# Patient Record
Sex: Female | Born: 1960 | Race: Black or African American | Hispanic: No | Marital: Married | Smoking: Former smoker
Health system: Southern US, Community
[De-identification: ages and names within clinical notes are randomized; demographics above are authoritative.]

## PROBLEM LIST (undated history)

## (undated) DIAGNOSIS — F410 Panic disorder [episodic paroxysmal anxiety] without agoraphobia: Secondary | ICD-10-CM

## (undated) DIAGNOSIS — I10 Essential (primary) hypertension: Secondary | ICD-10-CM

## (undated) DIAGNOSIS — G459 Transient cerebral ischemic attack, unspecified: Secondary | ICD-10-CM

## (undated) DIAGNOSIS — E559 Vitamin D deficiency, unspecified: Secondary | ICD-10-CM

## (undated) DIAGNOSIS — G43909 Migraine, unspecified, not intractable, without status migrainosus: Secondary | ICD-10-CM

## (undated) HISTORY — PX: CERVIX LESION DESTRUCTION: SHX591

## (undated) HISTORY — DX: Transient cerebral ischemic attack, unspecified: G45.9

## (undated) HISTORY — PX: CERVICAL BIOPSY  W/ LOOP ELECTRODE EXCISION: SUR135

## (undated) HISTORY — DX: Vitamin D deficiency, unspecified: E55.9

## (undated) HISTORY — DX: Panic disorder (episodic paroxysmal anxiety): F41.0

## (undated) HISTORY — DX: Essential (primary) hypertension: I10

---

## 1988-10-06 HISTORY — PX: CHOLECYSTECTOMY: SHX55

## 1998-10-06 DIAGNOSIS — G459 Transient cerebral ischemic attack, unspecified: Secondary | ICD-10-CM

## 1998-10-06 HISTORY — DX: Transient cerebral ischemic attack, unspecified: G45.9

## 2002-06-24 ENCOUNTER — Other Ambulatory Visit: Admission: RE | Admit: 2002-06-24 | Discharge: 2002-06-24 | Payer: Self-pay | Admitting: *Deleted

## 2004-04-21 ENCOUNTER — Emergency Department (HOSPITAL_COMMUNITY): Admission: EM | Admit: 2004-04-21 | Discharge: 2004-04-21 | Payer: Self-pay | Admitting: Family Medicine

## 2004-04-28 ENCOUNTER — Emergency Department (HOSPITAL_COMMUNITY): Admission: EM | Admit: 2004-04-28 | Discharge: 2004-04-28 | Payer: Self-pay | Admitting: *Deleted

## 2004-05-01 ENCOUNTER — Ambulatory Visit (HOSPITAL_COMMUNITY): Admission: RE | Admit: 2004-05-01 | Discharge: 2004-05-01 | Payer: Self-pay | Admitting: Obstetrics & Gynecology

## 2004-05-07 ENCOUNTER — Encounter: Admission: RE | Admit: 2004-05-07 | Discharge: 2004-05-07 | Payer: Self-pay | Admitting: Obstetrics and Gynecology

## 2005-10-06 HISTORY — PX: OVARIAN CYST REMOVAL: SHX89

## 2005-12-02 ENCOUNTER — Emergency Department (HOSPITAL_COMMUNITY): Admission: EM | Admit: 2005-12-02 | Discharge: 2005-12-02 | Payer: Self-pay | Admitting: Family Medicine

## 2005-12-24 ENCOUNTER — Encounter: Admission: RE | Admit: 2005-12-24 | Discharge: 2005-12-24 | Payer: Self-pay | Admitting: *Deleted

## 2006-02-15 ENCOUNTER — Emergency Department (HOSPITAL_COMMUNITY): Admission: EM | Admit: 2006-02-15 | Discharge: 2006-02-16 | Payer: Self-pay | Admitting: Emergency Medicine

## 2006-03-31 ENCOUNTER — Emergency Department (HOSPITAL_COMMUNITY): Admission: EM | Admit: 2006-03-31 | Discharge: 2006-03-31 | Payer: Self-pay | Admitting: Family Medicine

## 2006-06-15 ENCOUNTER — Other Ambulatory Visit: Admission: RE | Admit: 2006-06-15 | Discharge: 2006-06-15 | Payer: Self-pay | Admitting: *Deleted

## 2006-12-17 ENCOUNTER — Ambulatory Visit (HOSPITAL_COMMUNITY): Admission: AD | Admit: 2006-12-17 | Discharge: 2006-12-17 | Payer: Self-pay | Admitting: *Deleted

## 2006-12-17 HISTORY — PX: PELVIC LAPAROSCOPY: SHX162

## 2007-07-07 ENCOUNTER — Other Ambulatory Visit: Admission: RE | Admit: 2007-07-07 | Discharge: 2007-07-07 | Payer: Self-pay | Admitting: *Deleted

## 2007-07-07 HISTORY — PX: CARDIAC CATHETERIZATION: SHX172

## 2007-07-14 ENCOUNTER — Encounter: Admission: RE | Admit: 2007-07-14 | Discharge: 2007-07-14 | Payer: Self-pay | Admitting: *Deleted

## 2007-07-20 ENCOUNTER — Inpatient Hospital Stay (HOSPITAL_COMMUNITY): Admission: EM | Admit: 2007-07-20 | Discharge: 2007-07-21 | Payer: Self-pay | Admitting: Emergency Medicine

## 2008-01-17 ENCOUNTER — Observation Stay (HOSPITAL_COMMUNITY): Admission: EM | Admit: 2008-01-17 | Discharge: 2008-01-18 | Payer: Self-pay | Admitting: Emergency Medicine

## 2008-01-17 IMAGING — CR DG SHOULDER 2+V*L*
3 series · 3 of 3 positions shown · non-contrast
Comparison: None.

CLINICAL DATA: 47-year-old female with left shoulder pain and
numbness.  No known injury.

LEFT SHOULDER - 2+ VIEW

[w shoulder ap internal left]
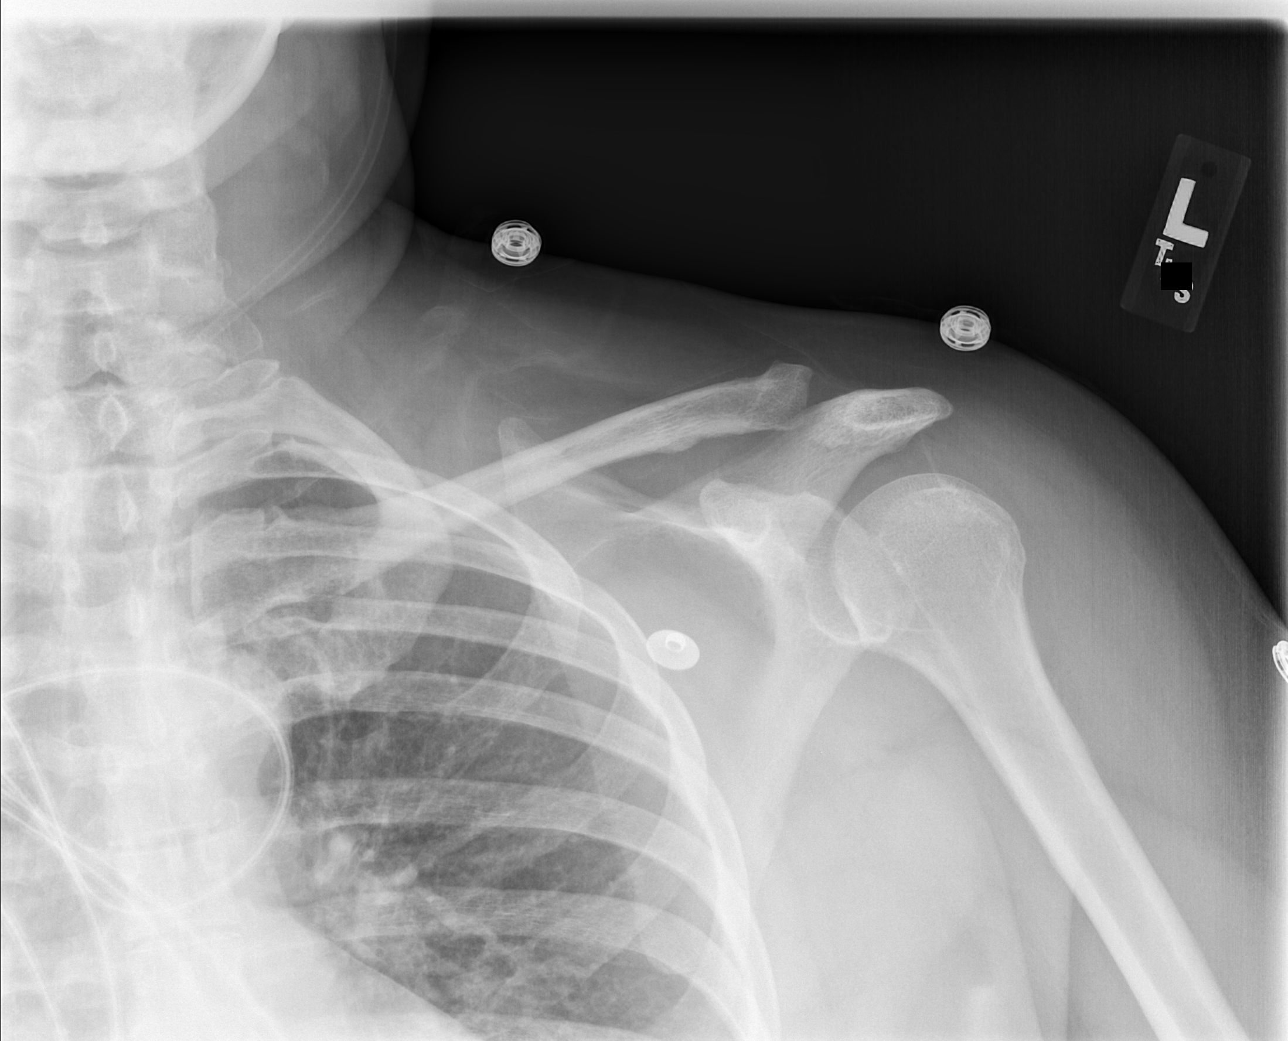

[w shoulder ap external left]
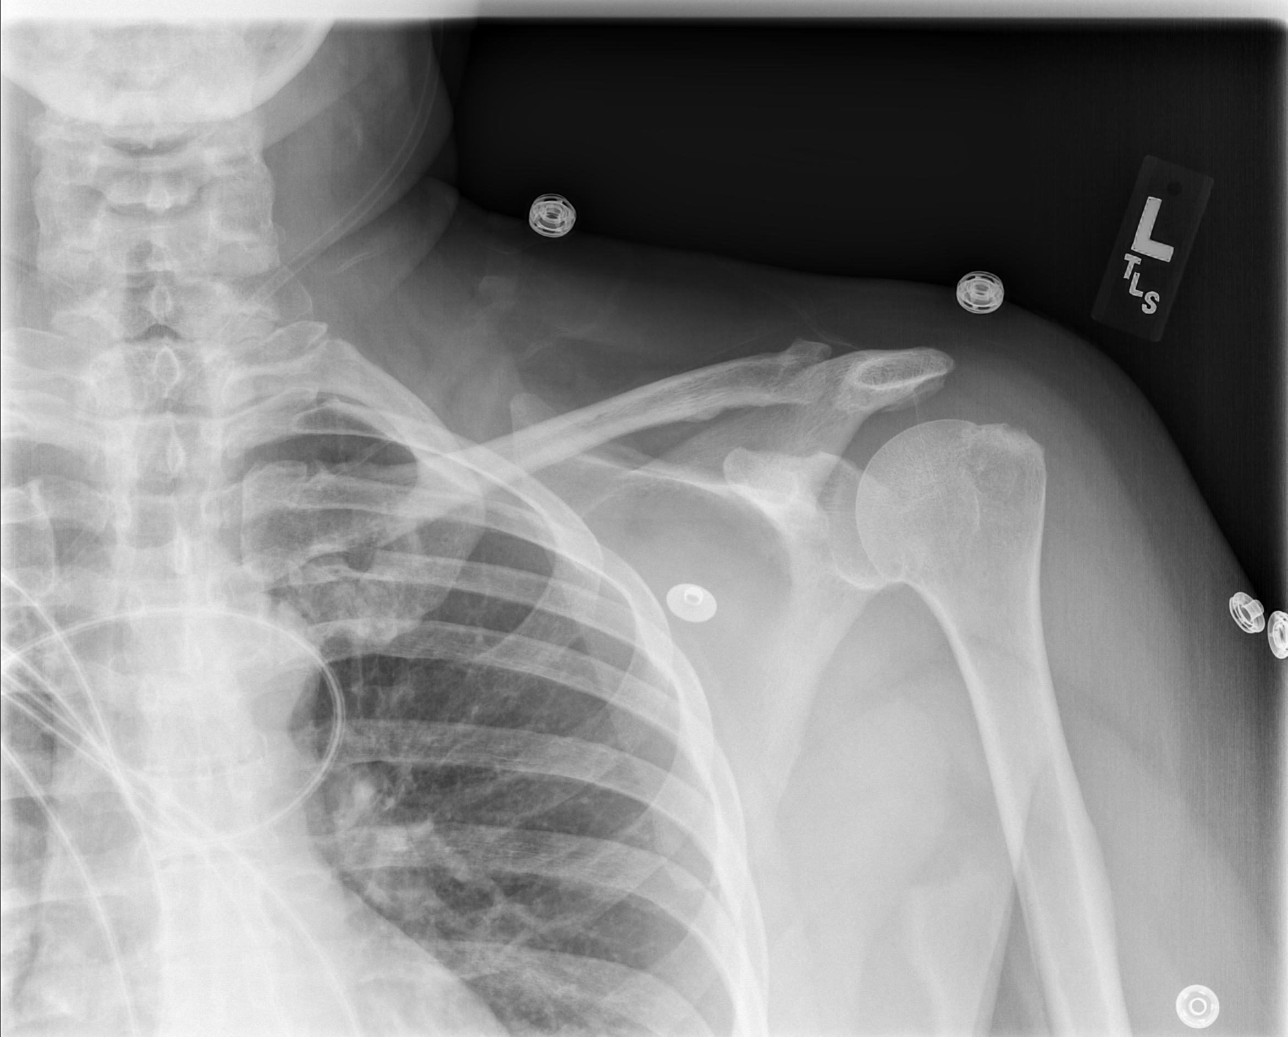

[w shoulder y view left]
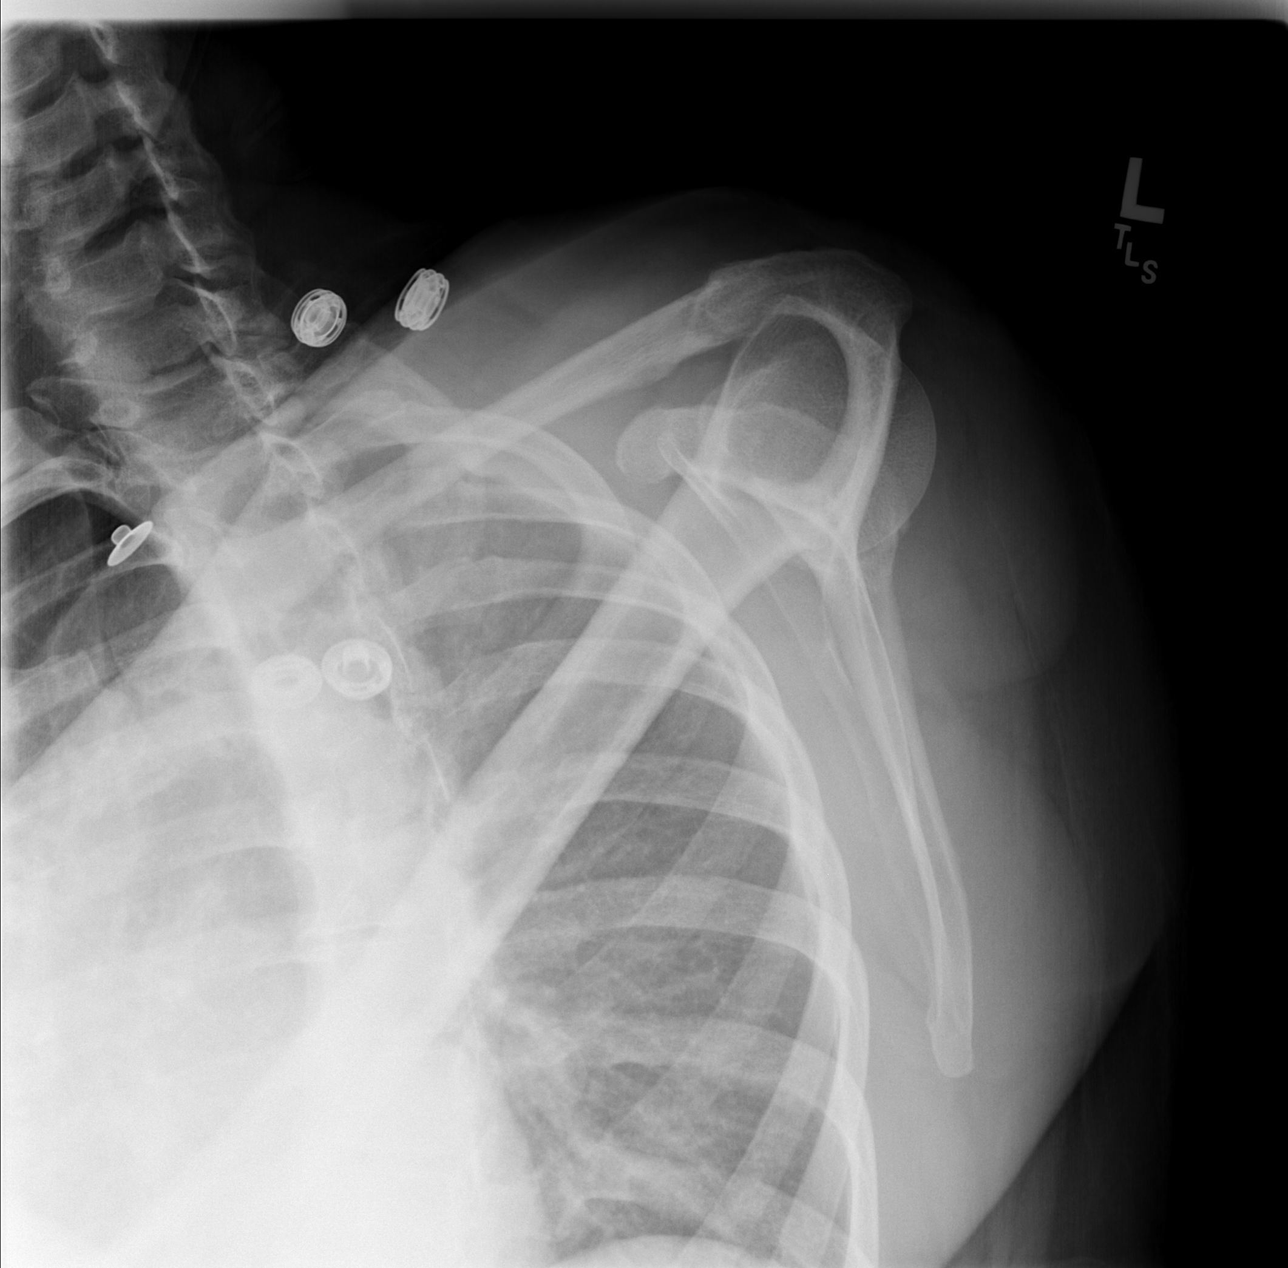

[3 of 3 positions shown; findings below may reference images not displayed]

FINDINGS: No glenohumeral joint dislocation.  Visualized proximal
left humerus is intact.  Left clavicle is intact.  Scapula appears
intact.  Visualized left ribs and left lung parenchyma are within
normal limits.
IMPRESSION: No acute fracture or dislocation identified about the left
shoulder.

## 2008-09-05 ENCOUNTER — Ambulatory Visit: Payer: Self-pay | Admitting: Gynecology

## 2008-09-12 ENCOUNTER — Encounter: Admission: RE | Admit: 2008-09-12 | Discharge: 2008-09-12 | Payer: Self-pay | Admitting: Internal Medicine

## 2008-09-13 ENCOUNTER — Ambulatory Visit: Payer: Self-pay | Admitting: Gynecology

## 2008-11-16 ENCOUNTER — Ambulatory Visit: Payer: Self-pay | Admitting: Gynecology

## 2008-11-20 ENCOUNTER — Inpatient Hospital Stay (HOSPITAL_COMMUNITY): Admission: RE | Admit: 2008-11-20 | Discharge: 2008-11-22 | Payer: Self-pay | Admitting: Gynecology

## 2008-11-20 ENCOUNTER — Ambulatory Visit: Payer: Self-pay | Admitting: Gynecology

## 2008-11-20 ENCOUNTER — Encounter: Payer: Self-pay | Admitting: Gynecology

## 2008-11-20 HISTORY — PX: ABDOMINAL HYSTERECTOMY: SHX81

## 2008-11-24 ENCOUNTER — Ambulatory Visit: Payer: Self-pay | Admitting: Women's Health

## 2008-11-30 ENCOUNTER — Ambulatory Visit: Payer: Self-pay | Admitting: Gynecology

## 2008-12-13 ENCOUNTER — Ambulatory Visit: Payer: Self-pay | Admitting: Gynecology

## 2009-01-10 ENCOUNTER — Ambulatory Visit: Payer: Self-pay | Admitting: Gynecology

## 2009-01-22 ENCOUNTER — Ambulatory Visit: Payer: Self-pay | Admitting: Gynecology

## 2009-09-10 ENCOUNTER — Other Ambulatory Visit: Admission: RE | Admit: 2009-09-10 | Discharge: 2009-09-10 | Payer: Self-pay | Admitting: Gynecology

## 2009-09-10 ENCOUNTER — Ambulatory Visit: Payer: Self-pay | Admitting: Gynecology

## 2009-09-27 ENCOUNTER — Encounter: Admission: RE | Admit: 2009-09-27 | Discharge: 2009-09-27 | Payer: Self-pay | Admitting: Gynecology

## 2010-01-23 ENCOUNTER — Emergency Department (HOSPITAL_COMMUNITY): Admission: EM | Admit: 2010-01-23 | Discharge: 2010-01-23 | Payer: Self-pay | Admitting: Family Medicine

## 2010-01-23 ENCOUNTER — Observation Stay (HOSPITAL_COMMUNITY): Admission: EM | Admit: 2010-01-23 | Discharge: 2010-01-23 | Payer: Self-pay | Admitting: Emergency Medicine

## 2010-06-15 ENCOUNTER — Emergency Department (HOSPITAL_COMMUNITY): Admission: EM | Admit: 2010-06-15 | Discharge: 2010-06-15 | Payer: Self-pay | Admitting: Family Medicine

## 2010-09-17 ENCOUNTER — Ambulatory Visit: Payer: Self-pay | Admitting: Gynecology

## 2010-09-17 ENCOUNTER — Other Ambulatory Visit
Admission: RE | Admit: 2010-09-17 | Discharge: 2010-09-17 | Payer: Self-pay | Source: Home / Self Care | Admitting: Gynecology

## 2010-10-14 ENCOUNTER — Encounter
Admission: RE | Admit: 2010-10-14 | Discharge: 2010-10-14 | Payer: Self-pay | Source: Home / Self Care | Attending: Gynecology | Admitting: Gynecology

## 2010-10-21 ENCOUNTER — Ambulatory Visit
Admission: RE | Admit: 2010-10-21 | Discharge: 2010-10-21 | Payer: Self-pay | Source: Home / Self Care | Attending: Gynecology | Admitting: Gynecology

## 2010-10-27 ENCOUNTER — Encounter: Payer: Self-pay | Admitting: Internal Medicine

## 2010-10-27 ENCOUNTER — Emergency Department (HOSPITAL_COMMUNITY)
Admission: EM | Admit: 2010-10-27 | Discharge: 2010-10-27 | Payer: Self-pay | Source: Home / Self Care | Admitting: Emergency Medicine

## 2010-10-27 ENCOUNTER — Encounter: Payer: Self-pay | Admitting: *Deleted

## 2010-10-27 ENCOUNTER — Encounter: Payer: Self-pay | Admitting: Gynecology

## 2010-10-27 ENCOUNTER — Encounter: Payer: Self-pay | Admitting: Family Medicine

## 2010-10-29 LAB — POCT RAPID STREP A (OFFICE): Streptococcus, Group A Screen (Direct): NEGATIVE

## 2010-12-24 LAB — POCT I-STAT, CHEM 8
Calcium, Ion: 1.14 mmol/L (ref 1.12–1.32)
Potassium: 3.5 mEq/L (ref 3.5–5.1)
Sodium: 143 mEq/L (ref 135–145)
TCO2: 30 mmol/L (ref 0–100)

## 2010-12-24 LAB — BASIC METABOLIC PANEL
Chloride: 106 mEq/L (ref 96–112)
GFR calc Af Amer: 57 mL/min — ABNORMAL LOW (ref >60)
Sodium: 139 mEq/L (ref 135–145)

## 2010-12-24 LAB — DIFFERENTIAL
Basophils Absolute: 0 10*3/uL (ref 0.0–0.1)
Basophils Relative: 1 % (ref 0–1)
Eosinophils Absolute: 0.3 10*3/uL (ref 0.0–0.7)
Eosinophils Relative: 4 % (ref 0–5)
Lymphocytes Relative: 33 % (ref 12–46)
Monocytes Absolute: 0.5 10*3/uL (ref 0.1–1.0)
Neutro Abs: 5 10*3/uL (ref 1.7–7.7)

## 2010-12-24 LAB — POCT CARDIAC MARKERS
CKMB, poc: <1 ng/mL — ABNORMAL LOW (ref 1.0–8.0)
Myoglobin, poc: 92.1 ng/mL (ref 12–200)
Troponin i, poc: <0.05 ng/mL (ref 0.00–0.09)

## 2010-12-24 LAB — CBC
HCT: 37.5 % (ref 36.0–46.0)
MCHC: 33.5 g/dL (ref 30.0–36.0)
Platelets: 195 10*3/uL (ref 150–400)
RDW: 14.2 % (ref 11.5–15.5)

## 2011-01-21 LAB — URINALYSIS, ROUTINE W REFLEX MICROSCOPIC
Ketones, ur: NEGATIVE mg/dL
Leukocytes, UA: NEGATIVE
Nitrite: NEGATIVE
Specific Gravity, Urine: <1.005 — ABNORMAL LOW (ref 1.005–1.030)
Urobilinogen, UA: 0.2 mg/dL (ref 0.0–1.0)
pH: 6.5 (ref 5.0–8.0)

## 2011-01-21 LAB — CBC
HCT: 38.6 % (ref 36.0–46.0)
Hemoglobin: 12.6 g/dL (ref 12.0–15.0)
MCHC: 33 g/dL (ref 30.0–36.0)
Platelets: 253 10*3/uL (ref 150–400)
Platelets: 190 10*3/uL (ref 150–400)
RBC: 3.55 MIL/uL — ABNORMAL LOW (ref 3.87–5.11)

## 2011-01-21 LAB — COMPREHENSIVE METABOLIC PANEL
BUN: 7 mg/dL (ref 6–23)
Calcium: 9 mg/dL (ref 8.4–10.5)
Glucose, Bld: 86 mg/dL (ref 70–99)
Total Protein: 7.5 g/dL (ref 6.0–8.3)

## 2011-01-21 LAB — BASIC METABOLIC PANEL
BUN: 5 mg/dL — ABNORMAL LOW (ref 6–23)
CO2: 27 mEq/L (ref 19–32)
Calcium: 8.3 mg/dL — ABNORMAL LOW (ref 8.4–10.5)
Creatinine, Ser: 1.28 mg/dL — ABNORMAL HIGH (ref 0.4–1.2)
GFR calc Af Amer: 54 mL/min — ABNORMAL LOW (ref >60)

## 2011-01-21 LAB — URINE MICROSCOPIC-ADD ON

## 2011-01-21 LAB — HCG, SERUM, QUALITATIVE: Preg, Serum: NEGATIVE

## 2011-02-18 NOTE — H&P (Signed)
[name], [name]      ACCOUNT NO.:  [id number]   MEDICAL RECORD NO.:  [id number]          PATIENT TYPE:  INP   LOCATION:  3029                         FACILITY:  [facility]   PHYSICIAN:  Deanna Artis. Hickling, M.D.DATE OF BIRTH:  Feb 19, 1961   DATE OF ADMISSION:  01/17/2008  DATE OF DISCHARGE:                              HISTORY & PHYSICAL   CHIEF COMPLAINT:  I couldn't lift my left side, my left shoulder  started to hurt.  This occurred around 4:10.  She called her husband  around 4:15.  She arrived at 24.  I was contacted at 1708 for a code  stroke.  I reviewed the CT scan at 1725 and found it to be normal.  The  patient states that the left arm and leg are weak and numb, although the  arm is more affected.  She complains of pain in her left shoulder, which  appears to be localized to the glenohumeral joint.  She was resistant to  her husband bringing her to the hospital and during the examination  keeps asking why she was numb, worrying about the 12,000 dollars of her  last hospitalization, which was negative cardiac cath, and saying that  she should not be in the hospital on spring break.   The patient has had number of stressors, she is worried about losing her  job.  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  There are problems at home.  She has had a longstanding  history of depression.  She said that she had many strokes number of  years ago, but she has not had any in quite some time.  She quit smoking  at that time.   Her risk factors for stroke include hypertension.  She has had some  problems with uterine fibroids and was scheduled to have hysterectomy  when she developed chest pain.  She was admitted to the hospital in  October and that workup was negative.   CURRENT MEDICATIONS:  1. Benicar 20/12.5, 1 daily.  2. Naproxen 550 mg every 8-12 hours.  3.  Metronidazole 500 mg 4 tablets per day, which she takes on a      monthly basis and not certain why.   DRUG ALLERGIES:  1. CODEINE.  2. DILAUDID.  3. ZOFRAN.   FAMILY HISTORY:  Maternal grandmother had stroke and died of lung  cancer.  Paternal grandmother died of cervical cancer.  Father has  hypertension.  Mother is alive and well, except that she had uterine  cancer and had a hysterectomy.  She is in remission.  The patient's  sister had cervical cancer.  Maternal aunt had a myocardial infarction.   SOCIAL HISTORY:  It has been described in part above.  The patient has 2  children and is married.  She has a number of concerns that are  preoccupying her now.  She quit smoking about 10 years ago.  She drinks  occasionally.  She does not use drugs.   REVIEW OF SYSTEMS:  Twelve-system review is remarkable for  weakness in  her left arm and pain in her left shoulder.  She is a gravida 2, para 2  and has depression.  Twelve-system review otherwise negative.   PHYSICAL EXAM:  VITAL SIGNS:  Temperature 98, blood pressure 144/78,  resting pulse 91, respirations 16, and oxygen saturation 100%.  GENERAL:  She is obese.  HEAD, EYES, EARS, NOSE, AND THROAT:  No infections.  No bruits.  NECK:  Supple.  She has pain in her left shoulder as noted above.  LUNGS:  Clear.  HEART:  No murmurs.  Pulses normal.  ABDOMEN:  Protuberant.  Bowel sounds normal.  No hepatosplenomegaly.  EXTREMITIES:  Well formed.  SKIN:  No lesions.  NEUROLOGIC:  Awake, alert, cheerful, speaking in a soft voice pursing  her lips, so that it sounds as though she is dysarthric.  Cranial  Nerves:  Round and reactive pupils.  Visual fields full.  Extraocular  movements full and conjugate.  She really has symmetric facial strength  when I asked her to smile, midline tongue and uvula.  Air conduction  greater than bone conduction bilaterally.  Motor examination:  The  patient drifts down with her left arm and leg, but when I  asked her to  give maximal effort for 2 seconds, she shows normal strength on the left  side.  She can wiggle her fingers and her toes, but in trying to oppose  her thumb and her fingers, she does well easily in the right hand.  With  the left hand, she very slowly brings her fingers together, as if she is  not able to do so.  The drift that she has is straight down.  There is  no pronator drift.  Sensation:  She has a left hemihypesthesia that  respects the midline.  She has good sensation to double simultaneous  stimuli.  It does not extinguish, and she had good stereognosis.  Gait  was not tested.  Reflexes were symmetric and diminished.  The patient  had bilateral flexor plantar responses.   IMPRESSION:  1. Left-sided weakness and numbness; I believe, this is the functional      exam.  2. Left shoulder pain.  3. Depression.   PLAN:  We will perform a limited MRI scan tonight and MRA; if negative,  we may discharge the patient in the morning.  I will discuss with my  partner, Dr. Pearlean Brownie, the utility of further workup at this time.  I  appreciate the opportunity to participate in her care.  I have shared my  concerns with her husband and asked him not to speak with the patient  about this.      Deanna Artis. Sharene Skeans, M.D.  Electronically Signed     [facility]/MEDQ  D:  01/17/2008  T:  01/18/2008  Job:  [id number]   cc:   Fleet Contras, M.D.

## 2011-02-18 NOTE — Cardiovascular Report (Signed)
NAMELAKEIDRA, RELIFORD      ACCOUNT NO.:  [id number]   MEDICAL RECORD NO.:  [id number]          [name] TYPE:  INP   LOCATION:  2915                         FACILITY:  [facility]   PHYSICIAN:  Nanetta Batty, M.D.   DATE OF BIRTH:  19-Mar-1961   DATE OF PROCEDURE:  DATE OF DISCHARGE:                            CARDIAC CATHETERIZATION   [name] is a 46-year mildly overweight African-American female  with history of hypertension, admitted by Dr. Gaspar Garbe B. Little on  July 20, 2007 with substernal chest pain.  She had a negative CT for  PE.  She ruled out for myocardial infarction.  She had no EKG changes.  She was pain free on heparin and nitro and presents now for diagnostic  coronary angiography to define anatomy and rule out ischemic etiology.   DESCRIPTION OF PROCEDURE:  [name] brought to the second floor [facility] in the postabsorptive state.  She was premedicated  with p.o. Valium, IV Versed and fentanyl.  Right groin was prepped and  shaved in usual sterile fashion.  One percent Xylocaine was used for  local anesthesia.  A 6-French sheath was inserted into the right femoral  artery using standard Seldinger technique.  A 6-French, Judkins  diagnostic catheter as well as [place] pigtail catheter were used for  selective cholangiography and left ventriculography, respectively.  Visipaque dye was used for the entirety of the case.  Aortic,  ventricular blood pressures were recorded.   HEMODYNAMIC RESULTS:  Aortic systolic pressure 105, diastolic pressure  93.  Left ventricular systolic pressure 117 and end-diastolic pressure  18.  There is no pullback gradient noted.   SELECTIVE CORONARY ANGIOGRAPHY:  1. Left main normal.  2. LAD.  The LAD had 30 to 40% hypodense lesion proximally after the      first diagonal branch and septal perforator.  3. Distal circumflex.  Nondominant and free of systemic disease.  4. Ramus branch.  Normal.  5. Right  coronary artery.  Dominant vessel and free of systemic      disease.   VENTRICULOGRAPHY:  RAO, left ventriculogram performed using 25 mL of  Visipaque dye at 12 mL per second.  The overall LVEF estimated greater  than 60% without focal wall motion abnormalities.   IMPRESSION:  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  I do not think her chest pain is ischemic.  Continued medical therapy will be recommended.   The ACT was measured.  A femoral shot was obtained to determine  appropriateness StarClose hemostasis which was done and successfully  deployed.  [name] left the lab in stable condition.   She will remain recumbent for 2 hours, will ambulate for 1 hour and be  discharged home on proton-pump inhibition.  She will see Gaspar Garbe B.  Little, M.D. back in follow-up.  She should have an outpatient Myoview  in light of her proximal LAD disease.      Nanetta Batty, M.D.  Electronically Signed     JB/MEDQ  D:  07/21/2007  T:  07/22/2007  Job:  [id number]  cc:   [name], M.D.  Fleet Contras, M.D.  [facility] 2nd floor [facility]

## 2011-02-18 NOTE — H&P (Signed)
NAME:  [name], [name]      ACCOUNT NO.:  [id number]   MEDICAL RECORD NO.:  [id number]          PATIENT TYPE:  AMB   LOCATION:  SDC                           [facility]   PHYSICIAN:  Juan H. Lily Peer, M.D.DATE OF BIRTH:  October 14, 1960   DATE OF ADMISSION:  DATE OF DISCHARGE:                              HISTORY & PHYSICAL   The patient is scheduled for surgery on Monday, November 20, 2008, 7:30  a.m. at [facility].  Please have history and physical available.   CHIEF COMPLAINT:  Symptomatic leiomyomatous uteri.   HISTORY:  The patient is a 50 year old gravida 2, para 2 who was first  seen in the office on September 05, 2008, and September 13, 2009,  respectively.  She had been referred to one of the primary care  physician in the community as a result of the patient's worsening  dysmenorrhea and menorrhagia, anemia, and leiomyomatous uteri.  She had  a normal Pap smear November 2009, which was reportedly normal.  The  patient was being followed by another gynecologist in the community, but  he passed away and she was followed up now in our office, but the  definitive treatment for her worsening dysmenorrhea, menorrhagia, and  anemia was total abdominal hysterectomy.  Despite the patient being 50  years of age, she requested to have her ovaries left in place.  She had  an ultrasound done in our office, which demonstrated she had a 14 x 7 x  6 cm uterus with multiple fibroids totally 20 of various sizes, the  largest one measuring 33 x 25 mm.  Both ovaries appeared to be normal.  Sonohysterogram demonstrated no intracavitary defect.  An endometrial  stripe was 1.8 mm.  An endometrial biopsy that was performed on the same  setting demonstrated benign weekly secretory endometrium with no  evidence of hyperplasia.  Her CBC had demonstrated white blood count of  12.9, hemoglobin/hematocrit with 12 and 36.9 respectively with a  platelet count of 294,000.  There was a question in  the past whether the  patient had TIAs or may have had some form of aneurysm.  She had been  followed by Dr. Pearlean Brownie, the neurologist who recently cleared her for  surgery.  He had seen her as part of the evaluation for mixed migraine  and tension headaches which appeared to have been stable during an  episode of a transient slurred speech and left-sided weakness in April  2009 with unclear etiology.  He thought possibly this was related to  stress and anxiety.  In April 2009, she had an MRI with and without  contrast and MRA.  The MRI was reported to be normal.  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 Pearlean Brownie.   PAST MEDICAL HISTORY:  She is allergic to CODEINE, LEIDEN, ZOFRAN.   Medications have consisted of Ambien p.r.n., multivitamin daily, calcium  and vitamin D twice  a day, Singulair p.r.n., Xanax 10 mg daily, Benicar  HCT q. daily, ibuprofen, and Naprosyn p.r.n., and recently she had been  placed on Megace 40 mg b.i.d. to have controlled her bleeding.   PAST MEDICAL HISTORY:  As mentioned above, questionable TIA in year  2000, history of panic attacks, history of hypertension.   SURGICAL HISTORY:  She had a negative cardiac catheterization in October  2008 done by Dr. Nanetta Batty in 2008, and his impression was that she  had a noncritical coronary artery disease with mild proximal left  anterior descending artery disease, normal left ventricular function,  and he did not feel at that time that her chest pain had been attributed  to any ischemia.  Other surgeries consisted of C-section 1982, cervical  cone biopsy, cholecystectomy in 1990, and also laparoscopy for torsed  right adnexa in 2007 resulting in laparotomy.   FAMILY HISTORY:  Parents  with hypertension.  Mother and paternal  grandmother with uterine cancer and maternal grandmother with lung  cancer.   PHYSICAL EXAMINATION:  VITAL SIGNS:  The patient weighs 124 pounds.  She  is 5 feet 3-1/4 inches tall.  Blood pressure 120/80.  HEENT:  Unremarkable.  NECK:  Supple.  Trachea midline.  No carotid bruits or thyromegaly.  LUNGS:  Clear to auscultation without rhonchi or wheezing.  HEART:  Regular rate and rhythm.  No murmurs or gallop.  BREAST:  Exam not done.  ABDOMEN:  Soft and slightly pendulous.  Subumbilical scar as well as  Pfannenstiel scar.  PELVIC:  Bartholin, urethra, Skene's within normal limits.  Vagina and  cervix; no gross lesions on inspection.  Uterus; irregular-sized uterus  approximately 12-14 weeks' size.  Adnexa difficult to evaluate as a  result of the large uterus.   ASSESSMENT:  A 50 year old gravida 2, para 2 with symptomatic  leiomyomatous uteri scheduled to undergo total abdominal hysterectomy  with ovarian conservation.  Risks, benefits, and pros and cons of the  operation were discussed include the following risks for deep venous  thrombosis and was resulting pulmonary embolism.  Prophylactically, we  will place PSA stockings.  Also risk for infection, she will be given  intravenous antibiotic.  Also in the event the patient to need any blood  or blood products, she is fully aware of the potential risks of 1 in  100,000 anaphylactic reaction from donor to recipient as well as  hepatitis and AIDS.  Also in the event of any trauma or injury to  bladder, intestines, or other internal organs, a reparative surgery may  needed to be done at that time, and also the remote possibility in the  event of a life saving measure or any abnormality noted that one or both  ovaries may need to be removed, thus she would need to be placed on  hormone replacement postoperatively, but all efforts will be undertaken  to preserve both ovaries per the patient's  wishes.  All these issues  were discussed with the patient.  All questions were answered.  We will  follow accordingly.   PLAN:  The patient scheduled for total abdominal hysterectomy with  ovarian conservation on Monday, November 20, 2008, at 7:30 a.m. at  [facility]  Please have history and physical available.      Juan H. Lily Peer, M.D.  Electronically Signed     JHF/MEDQ  D:  11/19/2008  T:  11/20/2008  Job:  [id number]

## 2011-02-18 NOTE — Discharge Summary (Signed)
NAME:  [name], [name]      ACCOUNT NO.:  [id number]   MEDICAL RECORD NO.:  [id number]          PATIENT TYPE:  INP   LOCATION:  9303                          [facility]ACILITY[facility]  [facility]   PHYSICIAN:  Juan H. Lily Peer, M.D.DATE OF BIRTH:  1961/04/01   DATE OF ADMISSION:  11/20/2008  DATE OF DISCHARGE:                               DISCHARGE SUMMARY   HISTORY:  The patient is a 50 year old gravida 2, para 2, who was taken  to the operating room in the morning of November 20, 2008, where she  underwent a total abdominal hysterectomy secondary to symptomatic  leiomyomas uteri that was contributing to dysmenorrhea, menorrhagia, and  anemia.  The patient with known history of hypertension.  She had been  followed by her primary physician, Dr. Robyne Askew, and the patient had been  on Benicar HCT for hypertension, and preoperatively, it had been noted  that the patient had a creatinine that was 1.24 and her potassium was  3.3 preoperatively.  The patient did well intraoperatively.  She had  lost approximately 150 mL of blood during her surgery.  On her first  postoperative day, hemoglobin and hematocrit were 9.8 and 29.6  respectively.  Platelet count 190,000.  Her serum electrolytes did  demonstrate potassium of 3.2.  Her creatinine was 1.28.  Glomerular  filtration rate for an African American was low at 54 postop, and  preoperatively it had been at 56.  The patient's Foley was removed after  24 hours.  She was placed on supplemental K-Dur orally, 20 mEq p.o.  daily.  She has been started on liquid diet after her Foley was removed,  and she began to ambulate and was voiding spontaneously.  She had a  great urinary output of greater than 400 mL per hour and clear.  By the  second day, she was up and ambulating.  She had taken a shower and  tolerated a regular diet.  She described at times having some spasms in  her bladder and urinary frequency, and she started a small spurt and  then regained her  urine, throughout the day.  Upon discharge, she will  be placed on Urispas, an antispasmodic agent to help with her bladder  spasms postoperatively.  She was ready to be discharged home on second  postoperative day.  She continued to remain normotensive.   FINAL DIAGNOSES:  1. Leiomyomas uteri.  2. Anemia.  3. Dysmenorrhea.  4. Hypokalemia.  5. Hypertension.  6. Anxiety.   PROCEDURE PERFORMED:  1. Total abdominal hysterectomy.  2. Supplementation with potassium.   FINAL DISPOSITION AND FOLLOWUP:  The patient was discharged home on her  second postoperative day.  She will make arrangements to follow up with  Dr. Robyne Askew, who is her internist to follow up due to her history of  hypertension and slightly elevated creatinine level.  She was given a  prescription of Darvocet to take one p.o. q.4-6 h. p.r.n. pain, and  Reglan 10 mg one p.o. q.4-6 h. p.r.n. nausea and vomiting, K-Dur 20 mEq  to take one p.o. daily  and iron supplementation one p.o. daily and Urispas 100 mg one p.o.  b.i.d. to  t.i.d. for the next 3-5 days for bladder spasms.  Discharge  instructions were provided.  She is to continue also her blood pressure  medication and her Xanax as previously prescribed.      Juan H. Lily Peer, M.D.  Electronically Signed     JHF/MEDQ  D:  11/22/2008  T:  11/22/2008  Job:  [id number]

## 2011-02-18 NOTE — Discharge Summary (Signed)
NAME:  [name], [name]      ACCOUNT NO.:  [id number]   MEDICAL RECORD NO.:  [id number]          PATIENT TYPE:  INP   LOCATION:  3029                         FACILITY:  [facility]   PHYSICIAN:  Pramod P. Pearlean Brownie, MD    DATE OF BIRTH:  12-24-1960   DATE OF ADMISSION:  01/17/2008  DATE OF DISCHARGE:  01/18/2008                               DISCHARGE SUMMARY   ADMISSION DIAGNOSIS:  Left-sided pain and weakness.   DISCHARGE DIAGNOSES:  1. Transient dysarthria and left-sided weakness and pain likely of      nonorganic etiology.  2. Depression, anxiety.   HOSPITAL COURSE:  Ms. Sappington is a 50 year old pleasant African  American lady, who was brought to [facility] emergency room for  evaluation for sudden onset of left shoulder pain and left upper  extremity numbness followed by some left lower extremity numbness and  weakness.  She also had some speech difficulties and seen in the  emergency room by Dr. Sharene Skeans.  She was found to have a neurological  exam, which was thought to be nonorganic.  She had some slurred speech  with very variable effort with some numbness on the left side with  objective weakness, which was giveaway.  She was not felt to be  candidate for thrombolysis due to her exam, which was thought to be  nonorganic and deficits were mild.  She was admitted for stroke workup.  The CT scan of the head was unremarkable.  She was kept on telemetry  monitoring, which did not seek cardiac arrhythmia.  Subsequently, an MRI  scan was obtained the next day, which was completely normal without  evidence of acute or old stroke.  MRA of the brain showed no significant  vascular stenosis.  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  The patient's rest of the workup included homocystine, which  was normal at 7.6, hemoglobin  A1c was normal at 5.4.  Lipid profile  showed total cholesterol 177, triglycerides were elevated minimally at  159, HDL was 38, LDL was borderline at 107.  The patient's white count  was slightly elevated at 13.5 with normal hemoglobin, hematocrit and  basic metabolic panel, and cardiac enzymes were also negative.  The  patient's symptoms resolved overnight, and she was given some Naprosyn,  ibuprofen for her shoulder pain, which also resolved.  On the day of  discharge, she had a nonfocal neurological exam with normal speech and  no left-sided weakness and numbness.  It was unclear as to the etiology  of her symptoms, but due to combination of symptoms starting with  shoulder pain, this was likely to be nonorganic and perhaps related to  underlying stress and anxiety.  The patient was started on aspirin 325  mg a day, advised to take it every day.  She was also advised to resume  her home medications; hydrochlorothiazide 12.5 mg a day, Avapro 150 mg  daily, and Naprosyn as needed for pain.  She was advised to follow up  with her  primary physician Dr. Alois Cliche in the next couple of weeks and  with Dr. Pearlean Brownie in his office in 2 months.  She was also informed that  the questionable 2 mm infundibulum or aneurysm in the posterior cerebral  artery may need a diagnostic cerebral angiogram, but this was not an  emergency and could be electively done as an outpatient.           ______________________________  Sunny Schlein. Pearlean Brownie, MD     PPS/MEDQ  D:  01/18/2008  T:  01/19/2008  Job:  [id number]

## 2011-02-18 NOTE — Op Note (Signed)
NAME:  [name], [name]      ACCOUNT NO.:  [id number]   MEDICAL RECORD NO.:  [id number]          PATIENT TYPE:  INP   LOCATION:  9303                          FACILITY:  [facility]   PHYSICIAN:  Juan H. Lily Peer, M.D.DATE OF BIRTH:  [birth year]   DATE OF PROCEDURE:  11/20/2008  DATE OF DISCHARGE:                               OPERATIVE REPORT   SURGEON:  Juan H. Lily Peer, MD   FIRST ASSISTANT:  Timothy P. Fontaine, MD   INDICATION FOR OPERATION:  A 50 year old gravida 2, para 2 with  dysmenorrhea, menorrhagia, anemia, and leiomyomatous uteri.   PREOPERATIVE DIAGNOSES:  1. Leiomyomatous uteri.  2. Dysmenorrhea.  3. Menorrhagia.  4. Anemia.   POSTOPERATIVE DIAGNOSES:  1. Leiomyomatous uteri.  2. Dysmenorrhea.  3. Menorrhagia.  4. Anemia.   ANESTHESIA:  General endotracheal anesthesia.   PROCEDURE PERFORMED:  Total abdominal hysterectomy.   FINDINGS:  A 12 to 14-week size leiomyomatous uteri and normal-appearing  ovaries.   DESCRIPTION OF OPERATION:  After the patient was adequately counseled,  she was taken to the operating room where she underwent a successful  general  endotracheal anesthesia.  A PSA stockings had been placed for  DVT prophylaxis and she had received a gram of cefotetan for prophylaxis  as well.  After the abdomen was prepped and draped in the usual sterile  fashion.  A Foley catheter had previously been placed for monitorization  of urinary output.  A Pfannenstiel skin incision was made 2 cm above the  symphysis pubis adjacent to the previous Pfannenstiel scar.  The  incision was carried out through skin and subcutaneous tissue down to  the rectus fascia where midline nick was made.  The fascia was incised  in the transverse fashion.  The peritoneal cavity was entered  cautiously.  O'Connor-O'Sullivan retractors were in place after the  patient had been placed in Trendelenburg position.  Assessment of the  uterus demonstrated multilobulated 12-14  weeks' size uterus with normal-  appearing ovaries.  Both proximal utero-ovarian ligament and fallopian  tube were grasped with Heaney clamps and placed under tension.  The  right round ligament was identified and transected with the Bovie.  The  surgeon's finger was placed in the posterior broad ligament and  penetrated through anteriorly in an effort to put a Heaney clamp,  hugging the uterus at the junction of the utero-ovarian ligament and  proximal tube.  Second Heaney clamp was placed adjacent to that and  transected between the remaining tube and ovary.  It was free tie of 0-  Vicryl suture followed by a transfixion stitch.  Skeletonization was  accomplished by incising the remainder of the broad and cardinal  ligaments to develop the bladder flap.  The broad and cardinal ligaments  serially clamped, cut, and suture ligated with 0-Vicryl suture to the  level of the right vaginal fornix.  Similar procedure was carried out on  the contralateral side and with the use of Jorgenson scissors, the  cervix was excised from the vagina and cervix and uterus was passed off  the operative field.  Both angles were secured with a transfixion stitch  of 0-Vicryl suture and  the remaining vaginal cuff was closed with  interrupted sutures of 0-Vicryl suture.  The pelvic cavity was copiously  irrigated with normal saline solution and for some of the raw surfaces  that were oozing after applying pressure for additional hemostasis.  Arista pattern was placed as a hemostatic agent.  Sponge and needle  count was correct.  The O'Connor-O'Sullivan retractor was removed.  The  visceral peritoneum was not reapproximated.  The rectus fascia was  closed with running stitch of 0-Vicryl suture.  The subcutaneous  bleeders were Bovie cauterized.  The skin was reapproximated with skin  clips followed by placed Xeroform gauze and 4 x4 dressings.  The patient  received 30 mg of Toradol en route to the recovery room.   IV fluids  consisted with 2 L of lactated Ringers.  Urine output was 400 mL.  An  EBL was 150 mL.      Juan H. Lily Peer, M.D.  Electronically Signed     JHF/MEDQ  D:  11/20/2008  T:  11/20/2008  Job:  [id number]

## 2011-02-21 NOTE — H&P (Signed)
NAMECIENNA, Cathy [name]      ACCOUNT NO.:  [id number]   MEDICAL RECORD NO.:  [id number]          [name] TYPE:  AMB   LOCATION:  DAY                          FACILITY:  [facility]   PHYSICIAN:  Cathy [name], M.D.   DATE OF BIRTH:  October 05, 1961   DATE OF ADMISSION:  12/17/2006  DATE OF DISCHARGE:                              HISTORY & PHYSICAL   CHIEF COMPLAINT:  Pain.   HISTORY:  The [name] is a 50 year old gravida 2, para 2, whose last  menstrual period was August 2007.  She has been placed on continuous  oral contraceptives since that time for suppression of menses, to  attempt to regulate menses.  This has been successful.  She is admitted  at this time because of severe right lower quadrant pain which began  approximately December 15, 2006 and has been intermittent but has now  become more continuous.  She experienced severe nausea and vomiting on  March 12, which has since relented.  She has had normal bowel activity  and normal appetite between bouts of pain.  Ultrasound performed in the  office on March 13 revealed enlarged, very tender right ovary.  This  suggested possible torsion of the ovary.  She is admitted at this time  for laparoscopy, possible right salpingo-oophorectomy.  She has been  fully counseled as to the nature of this procedure, to include the  procedure itself and risks involved including risks of anesthesia,  injury to uterus, tubes, ovaries, bowel, bladder, blood vessels,  ureters, postoperative hemorrhage, infection, recuperation.  She fully  understands all these considerations and wishes to proceed with surgery  on December 17, 2006.   PAST MEDICAL HISTORY:  Includes C-section in 1982 with tubal ligation at  that time, cholecystectomy approximately 1998, conization of the cervix  approximately that time as well.   ALLERGIES:  SHE IS ALLERGIC TO CODEINE, DILAUDID AND ZOFRAN.   MEDICATIONS:  Has taken the oral contraceptives which has been a  combination of estradiol and norethindrone, over the past 6 months.  Continuously, she has also had a prescription for Anaprox DS, which she  takes intermittently for some pain.   FAMILY HISTORY:  Includes maternal grandmother with carcinoma of the  lung and cervix and paternal grandmother with carcinoma of the ovary who  died at age 78.  Maternal grandmother also had diabetes.   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  She has changed her lifestyle habits and has had none in the  past 10 years approximately.  Recent CT scan of her brain has indicated  no problems.  She otherwise has had the gallbladder problem as noted for  an GI system.  CARDIORESPIRATORY:  Recent upper respiratory infection  which has resolved after being on Levaquin 750 mg daily.  GENITOURINARY:  As in present illness.  NEUROMUSCULAR:  Negative.   PHYSICAL EXAMINATION:  VITAL SIGNS:  Height 5 feet 3 inches, weight 218  pounds, blood pressure 122/72, pulse 104,  respirations 18, temperature  in our office of 98.4 Fahrenheit.  GENERAL:  Well-developed black female in moderate-to-severe distress, in  that she is posturing and writhing in pain somewhat.  HEENT:  Negative.  NECK:  Without masses, adenopathy or bruits.  LUNGS:  Clear to P&A.  HEART:  Regular rate and rhythm but elevated rate without murmurs.  BREASTS:  Examined sitting and lying, without mass.  Axilla negative.  ABDOMEN:  Soft with some tenderness in right lower quadrant and slight  rebound in this area.  Bowel sounds are somewhat depressed but are  active otherwise.  PELVIC:  External genitalia:  Bartholin's, urethra and Skene's glands  within normal limits.  Vagina is clean.  Cervix slightly inflamed.  Uterus mid-posterior, enlarged to approximately 8-[redacted] weeks gestational  size,  somewhat tender on motion.  ADNEXAL:  Reveals right to be full and very tender on palpation.  Left  is within normal limits.  Anterior posterior cul-de-sac exam is  confirmatory.  EXTREMITIES: Within normal limits.  CENTRAL NERVOUS SYSTEM:  Grossly intact.  SKIN:  Without suspicious lesions.   IMPRESSION:  Right adnexal pain, question ovarian torsion.   DISPOSITION:  As noted above.           ______________________________  Cathy [name], M.D.     SRF/MEDQ  D:  12/17/2006  T:  12/18/2006  Job:  [id number]

## 2011-02-21 NOTE — Discharge Summary (Signed)
NAMETAYNA, SMETHURST      ACCOUNT NO.:  [id number]   MEDICAL RECORD NO.:  [id number]          PATIENT TYPE:  INP   LOCATION:  2915                         FACILITY:  [facility]   PHYSICIAN:  Nanetta Batty, M.D.   DATE OF BIRTH:  1961-01-16   DATE OF ADMISSION:  07/20/2007  DATE OF DISCHARGE:  07/21/2007                               DISCHARGE SUMMARY   HISTORY:  Ms. Douglass is a 50 year old female who was referred to  [place] by Dr. Randell Patient for chest pain.  Please see admission history and  physical for complete details.  The patient's symptoms were worrisome  for unstable angina.  She was admitted to [facility] and started on IV  heparin.  CT scan was obtained to rule out pulmonary embolism.  This was  negative.  She underwent diagnostic catheterization July 21, 2007 by  Dr. Allyson Sabal which revealed a 30-40% proximal LAD lesion, but no other  significant disease, and normal LV function.  She was reassured that her  symptoms were most likely noncardiac.  She will get an outpatient  Myoview.  She will see Dr. Clarene Duke as an outpatient, apparently he has  seen other members of her family.   DISCHARGE MEDICATIONS:  1. Benicar HCTZ daily  2. Birth control pills as taken prior to admission.  3. Multivitamin daily.  4. Coated aspirin two 81 mg tablets a day.  5. Prilosec OTC once a day.   LABS:  White count 10.4, hemoglobin 11.6, hematocrit 34.9, platelets  274.  INR 0.9.  Sodium 139, potassium 3.5, BUN 4, creatinine 1.25.  LFTs  were normal.  Lipid panel shows a cholesterol of 187, triglycerides 156,  HDL 48, LDL 108.  TSH 1.08.  Urine pregnancy test is negative.  Urinalysis unremarkable.   DISPOSITION:  The patient is discharged in stable condition and will  follow up with Dr. Clarene Duke.      Abelino Derrick, P.A.      Nanetta Batty, M.D.  Electronically Signed    LKK/MEDQ  D:  08/11/2007  T:  08/12/2007  Job:  [id number]   cc:   Fleet Contras, M.D.  Nanetta Batty, M.D.

## 2011-02-21 NOTE — Op Note (Signed)
[name], [name]      ACCOUNT NO.:  [id number]   MEDICAL RECORD NO.:  [id number]          PATIENT TYPE:  AMB   LOCATION:  DAY                          [facility]ACILITY:  [facility]   PHYSICIAN:  Almedia Balls. Fore, M.D.   DATE OF BIRTH:  [birth year]   DATE OF PROCEDURE:  12/17/2006  DATE OF DISCHARGE:                               OPERATIVE REPORT   PREOPERATIVE DIAGNOSES:  1. Right lower quadrant pain.  2. Question right ovarian torsion.   POSTOPERATIVE DIAGNOSES:  1. Right lower quadrant pain.  2. Question right ovarian torsion.  3. 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   OPERATION:  Laparoscopy, repositioning of right ovary.   ANESTHESIA:  General orotracheal.   OPERATOR:  Almedia Balls. Randell Patient, M.D.   INDICATIONS:  The patient is a 50 year old with the above-noted  problems, who was counseled as to the need for surgery to evaluate and  treat these problems.  She was fully counseled as to the nature of the  procedure and the risks, to include risks of anesthesia; injury to  uterus, tubes, ovaries, bowel, bladder blood vessels, ureters;  postoperative hemorrhage; infection; and recuperation.  She fully  understands all these considerations and wishes to proceed on and has  signed informed consent to proceed on December 17, 2006.   OPERATIVE FINDINGS:  On laparoscopy there were dense adhesions in the  right upper quadrant since the patient was status post open  cholecystectomy.  The lower liver edge was seen for the most part and  noted to be normal.  Spleen was normal.  In the lower abdomen, the  appendix was somewhat retrocecal but was positioned so that it could be  evaluated fully and was totally normal.  The right ovary was placed  below a fibroid in the posterior cul-de-sac so that the pedicle of the  ovary was torsed.  Left ovary was elevated well and without problems.  Both tubes were previously surgically interrupted.  Uterus was  midposterior and fairly retroverted with myomata  throughout the  intramural area with particularly two fairly large myomata in the  posterior cul-de-sac, one of which apparently had trapped the ovary  behind it.   PROCEDURE:  With the patient under general anesthesia, prepared and  draped in the usual sterile fashion, a speculum was placed in vagina and  an acorn cannula and tenaculum were placed on the cervix, and the  patient was placed on a Foley catheter which was left to straight  drainage.  The patient was then re-prepared and draped for a  laparoscopic procedure.  An incision was made in the lower pole of the  umbilicus with insertion of the Veress cannula and insufflation of 3.5 L  of carbon dioxide.  The disposable 11 mm trocar for the operative scope  and the scope itself were then inserted into the peritoneal cavity.  A 5  mm disposable probe was inserted through an incision just above the  symphysis pubis, which was well above the reflection of the bladder and  well above her previous C-section scar.  The above-noted findings were  visualized.  The right ovary was then grasped and gently repositioned  above  the level of the uterine fundus, placing it into a normal anatomic  position.  Both ovaries were examined and noted to be fully normal in  terms of vascularity and without apparent compromise of vascular flow.  The fibroid on the right posterior lower uterine segment was somewhat  suffused and possibly was undergoing some necrosis, which could be a  source for pain as well.  After observing this area for continuation of  position and for hemostasis for several minutes, it was felt that all  was normal and that the procedure could be terminated.  Accordingly,  after noting that hemostasis was maintained and that sponge and  instrument counts were correct, the instruments were removed from the  peritoneal cavity.  Gas was allowed to fully escape, and the incisions  were closed with fascial sutures of 0 Vicryl and  subcuticular sutures of  3-0 plain catgut.  Estimated blood loss less than 25 mL.  The patient  was taken to the recovery room in good condition with clear urine in the  Foley catheter tubing, which was then discontinued prior to arriving in  PACU.   FOLLOW-UP CARE:  She will be released following PACU and was asked to  return the office in 2 weeks for follow-up.  She will call if unusual  bleeding, pain or unexplained fever should ensue and was given a  prescription for Darvocet-N 100 generic #30 to be taken one-half to two  q.4-6h. p.r.n. pain, and Macrobid #8 to be taken two stat and one b.i.d.           ______________________________  Almedia Balls. Randell Patient, M.D.     SRF/MEDQ  D:  12/17/2006  T:  12/19/2006  Job:  [id number]

## 2011-05-09 ENCOUNTER — Ambulatory Visit (INDEPENDENT_AMBULATORY_CARE_PROVIDER_SITE_OTHER): Payer: BC Managed Care – PPO | Admitting: Gynecology

## 2011-05-09 ENCOUNTER — Encounter: Payer: Self-pay | Admitting: Gynecology

## 2011-05-09 ENCOUNTER — Encounter: Payer: Self-pay | Admitting: Anesthesiology

## 2011-05-09 DIAGNOSIS — R300 Dysuria: Secondary | ICD-10-CM

## 2011-05-09 DIAGNOSIS — L293 Anogenital pruritus, unspecified: Secondary | ICD-10-CM

## 2011-05-09 DIAGNOSIS — B373 Candidiasis of vulva and vagina: Secondary | ICD-10-CM

## 2011-05-09 DIAGNOSIS — N898 Other specified noninflammatory disorders of vagina: Secondary | ICD-10-CM

## 2011-05-09 MED ORDER — FLUCONAZOLE 150 MG PO TABS: 150.0000 mg | ORAL_TABLET | Freq: Once | ORAL | Status: AC

## 2011-05-09 NOTE — Patient Instructions (Signed)
You have a yeast infection and I have called you in a prescription for Diflucan for you will need to take one tablet and I have called in two refills also.

## 2011-05-09 NOTE — Progress Notes (Signed)
Addended by: Cammie Mcgee T on: 05/09/2011 04:25 PM   Modules accepted: Orders

## 2011-05-09 NOTE — Progress Notes (Signed)
Patient presented to the office today for complaining of vulvar pruritus after she had come back from swimming recently. She is in a monogamous relationship. Her urinalysis today was negative. She was complaining of some urinary frequency but no dysuria no fever chills nausea vomiting or back pain.  Pelvic Bartholin urethra Skene glands within normal limits Vagina no gross lesions on inspection cervix no gross lesions fashion bimanual exam unremarkable rectal exam deferred  Wet prep: Demonstrated evidence of moniliasis  Plan Diflucan 150 mg 1 by mouth daily. Will send a urine for culture.

## 2011-07-01 LAB — COMPREHENSIVE METABOLIC PANEL WITH GFR
Alkaline Phosphatase: 48
BUN: 8
Chloride: 103
GFR calc non Af Amer: 40 — ABNORMAL LOW
Glucose, Bld: 114 — ABNORMAL HIGH
Potassium: 3 — ABNORMAL LOW
Total Bilirubin: 0.5

## 2011-07-01 LAB — DIFFERENTIAL
Basophils Absolute: 0.2 — ABNORMAL HIGH
Basophils Relative: 1
Eosinophils Absolute: 0.2
Eosinophils Relative: 2
Lymphocytes Relative: 27
Lymphs Abs: 3.6
Monocytes Absolute: 0.5
Monocytes Relative: 4
Neutro Abs: 9 — ABNORMAL HIGH
Neutrophils Relative %: 67

## 2011-07-01 LAB — LIPID PANEL
Cholesterol: 177
HDL: 38 — ABNORMAL LOW
LDL Cholesterol: 107 — ABNORMAL HIGH
Total CHOL/HDL Ratio: 4.7

## 2011-07-01 LAB — CK TOTAL AND CKMB (NOT AT ARMC)
CK, MB: 0.7
Relative Index: INVALID
Total CK: 87

## 2011-07-01 LAB — COMPREHENSIVE METABOLIC PANEL
ALT: 19
AST: 21
Albumin: 3.2 — ABNORMAL LOW
CO2: 25
Calcium: 9
Creatinine, Ser: 1.42 — ABNORMAL HIGH
GFR calc Af Amer: 48 — ABNORMAL LOW
Sodium: 137
Total Protein: 7.5

## 2011-07-01 LAB — CBC
HCT: 39.4
Hemoglobin: 13.1
MCHC: 33.2
MCV: 80.6
Platelets: 284
RBC: 4.89
RDW: 14.5
WBC: 13.5 — ABNORMAL HIGH

## 2011-07-01 LAB — APTT: aPTT: 25

## 2011-07-01 LAB — PROTIME-INR
INR: 0.9
Prothrombin Time: 12.6

## 2011-07-01 LAB — HOMOCYSTEINE: Homocysteine: 7.6

## 2011-07-01 LAB — TROPONIN I: Troponin I: 0.02

## 2011-07-01 LAB — HEMOGLOBIN A1C: Mean Plasma Glucose: 115

## 2011-07-16 LAB — BASIC METABOLIC PANEL
BUN: 4 — ABNORMAL LOW
CO2: 25
Calcium: 8.7
Chloride: 104
Creatinine, Ser: 1.25 — ABNORMAL HIGH
GFR calc Af Amer: 56 — ABNORMAL LOW

## 2011-07-16 LAB — CK TOTAL AND CKMB (NOT AT ARMC): Relative Index: 0.4

## 2011-07-16 LAB — CBC
MCHC: 33.3
MCV: 81
Platelets: 274
RBC: 4.31

## 2011-07-16 LAB — HEPARIN LEVEL (UNFRACTIONATED): Heparin Unfractionated: 0.73 — ABNORMAL HIGH

## 2011-07-16 LAB — LIPID PANEL
HDL: 48
LDL Cholesterol: 108 — ABNORMAL HIGH
Triglycerides: 156 — ABNORMAL HIGH
VLDL: 31

## 2011-07-16 LAB — PROTIME-INR
INR: 1
Prothrombin Time: 13

## 2011-07-16 LAB — APTT: aPTT: 69 — ABNORMAL HIGH

## 2011-07-17 LAB — CK TOTAL AND CKMB (NOT AT ARMC)
CK, MB: 0.6
CK, MB: 0.6
Relative Index: 0.4
Total CK: 110

## 2011-07-17 LAB — URINALYSIS, ROUTINE W REFLEX MICROSCOPIC
Bilirubin Urine: NEGATIVE
Glucose, UA: NEGATIVE
Hgb urine dipstick: NEGATIVE
Ketones, ur: NEGATIVE
Nitrite: NEGATIVE
pH: 7.5

## 2011-07-17 LAB — CBC
MCHC: 33.4
RDW: 14.5 — ABNORMAL HIGH

## 2011-07-17 LAB — COMPREHENSIVE METABOLIC PANEL
Alkaline Phosphatase: 41
BUN: 6
Calcium: 9
GFR calc non Af Amer: 43 — ABNORMAL LOW
Glucose, Bld: 91
Total Protein: 7

## 2011-07-17 LAB — HEPARIN LEVEL (UNFRACTIONATED): Heparin Unfractionated: 0.38

## 2011-07-17 LAB — DIFFERENTIAL
Basophils Relative: 1
Monocytes Relative: 4
Neutro Abs: 7.8 — ABNORMAL HIGH
Neutrophils Relative %: 65

## 2011-07-17 LAB — MAGNESIUM: Magnesium: 1.8

## 2011-07-17 LAB — TSH: TSH: 1.08

## 2011-07-17 LAB — PROTIME-INR
INR: 0.9
Prothrombin Time: 12.7

## 2011-07-17 LAB — TROPONIN I: Troponin I: 0.01

## 2011-08-04 ENCOUNTER — Encounter: Payer: Self-pay | Admitting: Obstetrics and Gynecology

## 2011-08-04 ENCOUNTER — Ambulatory Visit (INDEPENDENT_AMBULATORY_CARE_PROVIDER_SITE_OTHER): Payer: BC Managed Care – PPO | Admitting: Obstetrics and Gynecology

## 2011-08-04 DIAGNOSIS — N644 Mastodynia: Secondary | ICD-10-CM

## 2011-08-04 NOTE — Progress Notes (Signed)
Patient came to see me today with a two-month history of mastodynia behind her left nipple. She has not found a mass. She has a significant amount of caffeine and her diet. She a normal mammogram in January of 2012.  Breasts:[name] present. Both breasts were carefully examined in both the sitting and lying position. There are no skin changes. There no dominant lesions. She is tender behind her left nipple.  Assessment: Mastodynia  Plan: Diagnostic mammogram of left breast. Reduce caffeine. Vitamin E 400 mg daily.

## 2011-08-05 ENCOUNTER — Telehealth: Payer: Self-pay | Admitting: *Deleted

## 2011-08-05 ENCOUNTER — Other Ambulatory Visit: Payer: Self-pay | Admitting: *Deleted

## 2011-08-05 DIAGNOSIS — N644 Mastodynia: Secondary | ICD-10-CM

## 2011-08-05 NOTE — Telephone Encounter (Signed)
Message copied by Libby Maw on Tue Aug 05, 2011 12:44 PM ------      Message from: Trellis Paganini      Created: Mon Aug 04, 2011 12:32 PM       Please schedule diagnostic mammogram, possible ultrasound of left breast at the breast Center. Clinical diagnosis is mastodynia behind the left nipple.

## 2011-08-05 NOTE — Telephone Encounter (Signed)
Patient informed appointment set with [facility] on 08/19/11 @ 1:30pm.  Orders in pc.

## 2011-08-19 ENCOUNTER — Ambulatory Visit
Admission: RE | Admit: 2011-08-19 | Discharge: 2011-08-19 | Disposition: A | Discharge status: Home / Self Care | Payer: BC Managed Care – PPO | Source: Ambulatory Visit | Attending: Obstetrics and Gynecology | Admitting: Obstetrics and Gynecology

## 2011-08-19 DIAGNOSIS — N644 Mastodynia: Secondary | ICD-10-CM

## 2011-09-26 ENCOUNTER — Ambulatory Visit (INDEPENDENT_AMBULATORY_CARE_PROVIDER_SITE_OTHER): Payer: BC Managed Care – PPO | Admitting: Gynecology

## 2011-09-26 ENCOUNTER — Other Ambulatory Visit: Payer: Self-pay | Admitting: Gynecology

## 2011-09-26 ENCOUNTER — Other Ambulatory Visit (HOSPITAL_COMMUNITY)
Admission: RE | Admit: 2011-09-26 | Discharge: 2011-09-26 | Disposition: A | Discharge status: Home / Self Care | Payer: BC Managed Care – PPO | Source: Ambulatory Visit | Attending: Gynecology | Admitting: Gynecology

## 2011-09-26 ENCOUNTER — Encounter: Payer: Self-pay | Admitting: Gynecology

## 2011-09-26 VITALS — BP 128/84 | Ht 63.0 in | Wt 223.0 lb

## 2011-09-26 DIAGNOSIS — Z01419 Encounter for gynecological examination (general) (routine) without abnormal findings: Secondary | ICD-10-CM | POA: Insufficient documentation

## 2011-09-26 DIAGNOSIS — Z1231 Encounter for screening mammogram for malignant neoplasm of breast: Secondary | ICD-10-CM

## 2011-09-26 DIAGNOSIS — I10 Essential (primary) hypertension: Secondary | ICD-10-CM | POA: Insufficient documentation

## 2011-09-26 DIAGNOSIS — Z1211 Encounter for screening for malignant neoplasm of colon: Secondary | ICD-10-CM

## 2011-09-26 LAB — POC HEMOCCULT BLD/STL (OFFICE/1-CARD/DIAGNOSTIC): Fecal Occult Blood, POC: NEGATIVE

## 2011-09-26 NOTE — Patient Instructions (Signed)
Dietary therapy for weight gain   INTRODUCTION - The optimal management of overweight and obesity requires a combination of diet, exercise, and behavioral modification. In addition, some patients eventually require pharmacologic therapy or bariatric surgery. The risk of overweight to the subject should be evaluated before beginning any treatment program. Selection of treatment can then be made using a risk-benefit assessment). The choice of therapy is dependent on several factors including the degree of overweight or obesity and patient preference.  This topic will review the dietary therapy of obesity. Other aspects of treatment are discussed separately. (See "Health hazards associated with obesity in adults" and "Overview of therapy for obesity in adults" and "Drug therapy of obesity" and "Behavioral strategies in the treatment of obesity".) GOALS OF WEIGHT LOSS - It is important to set goals when discussing a dietary weight loss program with an individual patient. An initial weight loss goal of 5 to 7 percent of body weight is realistic for most individuals. The first goal for any overweight individual is to prevent further weight gain and keep body weight stable (within 5 pounds of its current level).  The goal of the clinician is to identify and review with the patient a realistic weight-loss goal. Most patients have a weight loss goal of 30 percent or more below current weight, which is unrealistic [1].  A successful program will lead to a weight loss of more than 5 percent of initial weight [2]. A weight loss of more than 5 percent can reduce risk factors for cardiovascular disease, such as dyslipidemia, hypertension, and diabetes mellitus [3]. In the Diabetes Prevention Program, a multi-center trial in patients with impaired glucose tolerance, weight loss of 7 percent reduced the rate of progression from impaired glucose tolerance to diabetes by 58 percent  [4]. (See "Prediction and prevention of type 2 diabetes mellitus", section on 'Diabetes Prevention Program'.)  Loss of 5 percent of initial body weight and maintenance of this loss is a good medical result, even if the subject does not reach his or her "dream" weight.  Although an extremely difficult goal to achieve, a body mass index (BMI) between 20 and 25 kg/m2 puts the subject in the lowest risk category (table 1 and figure 1). DIETARY ENERGY Rate of weight loss - The rate of weight loss is directly related to the difference between the subject's energy intake and energy requirements. Reducing caloric intake below expenditure results in a predictable initial rate of weight loss that is related to the energy deficit [5,6]. However, prediction of weight loss for an individual subject can be difficult because of marked intersubject variability in initial body composition, adherence, and energy expenditure [5,7]. Food records are often inaccurate. Most normal-weight people under-report what they eat by 10 to 30 percent, while overweight people under-report by 30 percent or more [8]. In addition, energy requirements are influenced by fidgeting, gender, age, and genetic factors [5,6,9]. As examples: Men lose more weight than women of similar height and weight when they comply with eating any given diet because men have more lean body mass, less percent body fat, and therefore higher energy expenditure.  Older subjects of either sex have a lower energy expenditure and therefore lose weight more slowly than younger subjects; metabolic rate declines by approximately 2 percent per decade (about 100 kcal/decade) [10].  The importance of genetic factors is illustrated by a study of identical female twin pairs who were overfed to induce weight gain [11]. Twelve twin pairs were overfed by 1000 kcal/day for  84 of 100 days. The degree of weight gain at a constant dietary caloric increment varied widely among the twin pairs  (from 4.3 to 13.3 kg), in fact, there was three times the variance for both weight and fat mass among the twin pairs when compared with that within the twin pairs. Approximately 22 kcal/kg is required to maintain a kilogram of body weight in a normal adult. Thus, the expected or calculated energy expenditure for a woman weighing 100 kg is approximately 2200 kcal/day. The variability of 20 percent could give energy needs as high as 2620 kcal/day or as low as 1860 kcal/day. An average deficit of 500 kcal/day should result in an initial weight loss of approximately 0.5 kg/week (1 lb/week). However, after three to six months of weight loss, energy expenditure adaptations occur, which slow the bodyweight response to a given change in energy intake, thereby diminishing ongoing weight loss [7]. There are several methods of formally estimating energy expenditure; we suggest using the WHO criteria (table 2). This method allows a direct estimate of resting metabolic rate (RMR) and calculation of daily energy requirement. The low activity level (1.3 x RMR) includes subjects who lead a sedentary life. The high activity level (1.7 x RMR) applies to those in jobs requiring manual labor or patients with regular daily physical exercise programs [12]. Maintenance of weight loss - It is important for the overweight subject to understand that achieving and maintaining weight loss is made difficult by the reduction in energy expenditure that is induced by weight loss (figure 2) [13]. Weight loss maintenance is also difficult because of changes in the peripheral hormone signals that regulate appetite. Gastrointestinal peptides, such as ghrelin, which stimulates appetite, and gastric inhibitory polypeptide, which may promote energy storage, increase after diet-induced weight loss. Other circulating mediators that inhibit intake (eg, leptin, peptide YY, cholecystokinin, pancreatic polypeptide) decrease. These hormonal adaptations  favoring weight gain persist for at least one year after diet-induced weight loss [14]. (See "Overview of therapy for obesity in adults", section on 'Maintenance of weight loss' and "Pathogenesis of obesity", section on 'Ghrelin'.)  TYPES OF DIETS - The general consensus is that excess intake of calories from any source, associated with a sedentary lifestyle, causes weight gain and obesity. The goal of dietary therapy, therefore, is to decrease energy intake from food. Conventional diets are defined as those below energy requirements but above 800 kcal/day [15]. These diets fall into four groups: Balanced low-calorie diets/portion-controlled diets  Low-fat diets  Low-carbohydrate diets  Mediterranean diet  Fad diets (diets involving unusual combinations of foods or eating sequences) Commercial weight loss programs and internet-based programs are discussed elsewhere. (See "Behavioral strategies in the treatment of obesity".) Balanced low-calorie diets - Planning a diet requires the selection of a caloric intake and then selection of foods to meet this intake. It is desirable to eat foods with adequate nutrients in addition to protein, carbohydrate, and essential fatty acids. Thus, weight-reducing diets should eliminate alcohol, sugar-containing beverages, and most highly concentrated sweets because they rarely contain adequate amounts of other nutrients besides energy. Breakdown of some protein is to be expected during weight loss. When weight increases as a result of overeating, approximately 75 percent of the extra energy is stored as fat and the remaining 25 percent as lean tissue. If the lean tissue contains 20 percent protein, then 5 percent of the extra weight gain would be protein. Thus, it should be anticipated that during weight loss, at least 5 percent of weight loss will  be protein. A desirable feature of any calorie restricted diet, however, is that it results in the lowest possible loss of  protein, recognizing that this will not be less than 5 percent of the weight that is lost. Portion-controlled diets - One simple approach to providing a calorie-controlled diet is to use individually packaged foods, such as formula diet drinks using powdered or liquid formula diets, nutrition bars, frozen food, and pre-packaged meals that can be stored at room temperature as the main source of nutrients. Frozen low-calorie meals containing 250 to 350 kcal/package can be a convenient and nutritious way to do this. We have often recommended the use of formula diets or breakfast bars for breakfast, formula diets or a frozen lunch entree for lunch, and a frozen calorie-controlled entree with additional vegetables for dinner. In this way, it is possible to obtain a calorie-controlled 1000 to 1500 kcal per day diet. In one four-year study this approach resulted in early initial weight loss, which then was maintained [16]. I do not recommend the use of formula diets alone because they do not provide adequate nutritional variety. Low-fat diets - Low-fat diets are another standard strategy to help patients lose weight, and almost all dietary guidelines recommend a reduction in the daily intake of fat to 30 percent of energy intake or less [17,18]. In a meta-analysis of trials comparing low-fat diets (typically 20 to 25 percent of energy from fats) with a control group consuming a usual diet or a medium fat diet (usually 35 to 40 percent of energy), there was greater weight loss (approximately 3 kg) with low-fat compared with moderate fat diets [19]. In addition, one report noted that people who successfully keep their weight reduced adopt three strategies, one of which is eating a lower fat diet [20]. (See "Dietary fat" and "Etiology and natural history of obesity", section on 'Dietary habits'.) A low-fat dietary pattern with healthy carbohydrates is not associated with weight gain. This was illustrated by the [facility] Dietary Modification Trial of [phone] postmenopausal women over age 50 years who were randomly assigned to a dietary intervention that included group and individual sessions to promote a decrease in fat intake and increases in fruit, vegetable, and grain consumption (healthy carbohydrates), but did not include weight loss or caloric restriction goals, or a control group which received only dietary educational materials [21]. After an average of 7.5 years of follow-up, the following results were seen: Women in the intervention group lost weight in the first year (mean of 2.2 kg) and maintained lower weight than the control women at 7.5 years (difference of 1.9 kg at one year, and 0.4 kg at 7.5 years).  No tendency toward weight gain was seen in the intervention group overall, or when stratified by age, ethnicity, or body mass index.  Weight loss was related to the level of fat intake and was greatest in women who decreased their percentage of energy from fat the most. A similar, but lesser trend was seen with increased vegetable and fruit intake. A low-fat diet can be implemented in two ways. First, the dietitian can provide the subject with specific menu plans that emphasize the use of reduced fat foods. As one guideline, if a food "melts" in your mouth, it probably has fat in it. Second, subjects can be instructed in counting fat grams as an alternative to counting calories. Fat has 9.4 kcal/g. It is thus very easy to calculate the number of grams of fat a subject can eat for  any given level of energy intake. Many experts recommend keeping calories from fat to below 30 percent of total calories. In practical terms, this means eating about 33 g of fat for each 1000 calories in the diet. For simplicity, I use 30 g of fat or less for each 1000 kcal. For a 1500-calorie diet, this would mean about 45 g or less of fat, which can be counted using the nutrition information labels on food  packages. Low-carbohydrate diets - Proponents of low-carbohydrate diets have argued that the increasing obesity epidemic may be in part due to low-fat, high-carbohydrate diets. But this may be dependent upon the type of carbohydrates that are eaten, such as energy dense snacks and sugar or high fructose containing beverages. The carbohydrate content of the diet is an important determinant of short-term (less than two weeks) weight loss. Low (60 to 130 grams of carbohydrates) and very low-carbohydrate diets (0 to <60 grams) have been popular for many years [15]. Restriction of carbohydrates leads to glycogen mobilization and, if carbohydrate intake is less than 50 g/day, ketosis will develop. Rapid weight loss occurs, primarily due to glycogen breakdown and fluid loss rather than fat loss. Low and very low-carbohydrate diets are more effective for short-term weight loss than low-fat diets, although probably not for long-term weight loss. A meta-analysis of five trials found that the difference in weight loss at six months, favoring the low carbohydrate over low fat diet, was not sustained at 12 months [22]. (See 'Comparison trials' below.) Low-carbohydrate diets may have some other beneficial effects with regard to risk of developing type 2 diabetes mellitus, coronary heart disease, and some cancers, particularly if attention is paid to the type as well as the quantity of carbohydrate. A low-carbohydrate diet can be implemented in two ways, either by reducing the total amount of carbohydrate or by consuming foods with a lower glycemic index or glycemic load (table 3). Glycemic index and load are reviewed separately. (See "Dietary carbohydrates", section on 'Glycemic index'.) If a low-carbohydrate diet is chosen, healthy choices for fat (mono- and polyunsaturated fats) and protein (fish, nuts, legumes, and poultry) should be encouraged because of the association between saturated fat intake and risk of coronary  heart disease. During 26 years of follow-up of women in the Nurses' Health Study and 20 years of follow-up of men in the Health Professionals' Follow-up Study, low carbohydrate diets in the highest versus lowest decile for vegetable proteins and fat were associated with lower all-cause mortality (HR 0.80, 95% CI 0.75-0.85) and cardiovascular mortality (HR 0.77, 95% CI 0.68-0.87) [23]. In contrast, low carbohydrate diets in the highest versus lowest decile for animal protein and fat were associated with higher all-cause (HR 1.23, 95% CI 1.11-1.37) and cardiovascular (HR 1.14, 95% CI 1.01-1.29) mortality. (See "Dietary fat" and "Overview of primary prevention of coronary heart disease and stroke", section on 'Healthy diet'.) High protein diets - Some popular books recommend high protein diets [24]. In one trial, low-fat diets with 12 percent and 25 percent protein content were compared. Weight loss over six months was greater with the higher protein diet (9 versus 5 kg), but the difference was no longer significant at 12 and 24 months [25]. Higher protein diets may improve weight maintenance, as illustrated by the results of a study of 60 subjects randomly assigned to a low fat, high protein versus low-fat, high-carbohydrate diet after completing a four week very low calorie diet [26]. Among the subjects who completed the three-month study (n = 48), the  high protein diet group had significantly better weight maintenance (between group difference of 2.3 kg). High dietary protein intake, due to its acid-producing load, increases urinary calcium excretion (with potential risk for bone loss and calcium stone formation) [27]. Urinary calcium excretion does appear to increase when dietary intake of protein increases [27-29], and this could pose a long-term risk for nephrolithiasis. (See "Risk factors for calcium stones in adults", section on 'Dietary risk factors'.) However, two small randomized trials that looked at  bone metabolism found evidence that increased dietary protein may decrease bone resorption [28,29]. One of the trials found that increased intestinal absorption of calcium was primarily responsible for the increased urinary excretion of calcium and that the excreted calcium was not coming from bone [29]. Mediterranean diet - The term Mediterranean diet refers to a dietary pattern that is common in [place] areas of the [place] area. Although there is some variation in Mediterranean diets, there are some common components that include a high level of monounsaturated fat relative to saturated; moderate consumption of alcohol, mainly as wine; a high consumption of vegetables, fruits, legumes, and grains; a moderate consumption of milk and dairy products, mostly in the form of cheese; and a relatively low intake of meat and meat products. A meta-analysis of 12 studies involving eight cohorts found that a Mediterranean diet was associated with improved health status and reductions in overall mortality, cardiovascular mortality, cancer mortality, and incidence of Parkinson's disease and Alzheimer's disease [30]. (See "Healthy diet in adults", section on 'Mediterranean diet'.) Very low-calorie diets - Diets with energy levels between 200 and 800 kcal/day are called "very low-calorie diets," while those below 200 kcal/day can be termed starvation diets. The basis for these diets was the notion that the lower the calorie intake the more rapid the weight loss, because the energy withdrawn from body fat stores is a function of the energy deficit. Starvation is the ultimate very low-calorie diet and results in the most rapid weight loss. Although once popular, starvation diets are now rarely used for treatment of obesity. Very low-calorie diets have not been shown to be superior to conventional diets for long-term weight loss. In a meta-analysis of six trials comparing very low-calorie diets with conventional  low-calorie diets, short-term weight loss was greater with very low-calorie diets (16.1 versus 9.7 versus percent of initial weight), but there was no difference in long-term weight loss (6.3 versus 5.0 percent) [31]. As with all diets, very low-calorie diets initially result in substantial protein loss that diminishes with time. Other expected effects include reduction in blood pressure and improvement in hyperglycemia in diabetic patients. Subjects adhering to very low-calorie diets usually have a fall in blood pressure, especially during the first week. Antihypertensive drugs, especially calcium channel blockers and diuretics, should usually be discontinued when a very low calorie diet is begun unless moderate to severe hypertension is present.  Most diabetic patients eating very low-calorie diets have marked improvement in hyperglycemia. Blood glucose concentrations fall within the first one to two weeks, and remain lower as long as the diet is continued. Those patients taking less than 50 units of insulin or an oral hypoglycemic drug will usually be able to discontinue therapy [32]. The side effects of very low-calorie diets include hair loss, thinning of the skin, and coldness. These diets are contraindicated for lactating and pregnant women, and in children who require protein for linear growth. As with all diets, there is increased cholesterol mobilization from peripheral fat stores, thus increasing the risk of  gallstones. Very low-calorie diets should be reserved for subjects who require rapid weight loss for a specific purpose, such as surgery. The weight regain when the diet is stopped is often rapid, and it is better to take a more sustainable approach than to use a method that cannot be sustained. Comparison trials - The impact of specific dietary composition on weight change remains uncertain. When energy from dietary carbohydrates decreases, energy from fat sources tends to increase. The reverse  is also true; when energy from dietary fats decreases, energy from carbohydrate sources tends to increase. The debate has mainly centered on whether low-fat or low-carbohydrate diets can better induce weight loss and sustain it over the long-term. Weight loss diets - Initial trials evaluating the effect of type of diet (predominantly low-carbohydrate versus low-fat) on weight loss and other outcomes showed that weight loss at six months was approximately 4 kg greater in the very low-carbohydrate group than in the low-fat group [33-35]. Trials lasting for one year, however, did not find a significant difference in weight loss [34,36,37]. A meta-analysis of five trials (including one study not referenced above) found that the difference in weight loss at six months, favoring the low carbohydrate over low fat diet, was not sustained at 12 months [22]. In one study, this convergence was mainly due to regain of weight in the low-carbohydrate group [34]; in another, the convergence was due to ongoing weight loss in the low-fat group (figure 3) [36]. Some of these initial comparison trials of different dietary regimens had important limitations [22]. These included high dropout rates (21 to 48 percent), suboptimal dietary compliance, and limited long-term follow-up. Subsequent trials are larger, of longer duration (lasting one to two years), and have conflicting results with regard to the impact of macronutrient composition on weight loss [38-41]. In contrast, all trials found that dietary adherence is an important determinant of weight loss, independent of macronutrient composition. The following observations illustrate the range of findings in these trials: In one trial, 322 moderately obese subjects (86 percent men) were randomly assigned to a low-fat (restricted calorie), Mediterranean (moderate-fat, restricted calorie, rich in vegetables, low in red meat), or low-carbohydrate (non-restricted-calorie) diet for two  years [38]. Adherence rates were higher than those reported in previous trials (95.4 and 84.6 percent at one and two years, respectively). Weight loss was greater with the Mediterranean and low-carbohydrate diets than the low-fat diet (mean weight loss 4.4, 4.7, and 2.9 kg, respectively).  The most favorable effect on lipids (increased HDL and decreased triglycerides and ratio of total cholesterol to HDL) was seen in the low-carbohydrate group. Among subjects with type 2 diabetes, the greatest improvement in glycemic control occurred with the Mediterranean diet. Among all groups, weight loss was greater for those who completed the two year study than for those who withdrew.  Another randomized trial compared four different diets in 311 overweight and obese premenopausal women: very low-carbohydrate (Atkins); macronutrient balance controlling glycemic load (Zone); general calorie restriction, low-fat (LEARN); and very low-fat (Ornish) [39]. In the intention-to-treat analysis at one year, mean weight loss was greater in the Atkins diet group compared with the other groups (4.7, 1.6, 2.2, and 2.6 kg, respectively). Pairwise comparisons showed a significant difference only for Atkins versus Zone.  The most favorable effect on triglycerides and HDL-C was seen in the Atkins group. Dietary adherence rates (77 to 88 percent) were similar among the groups and better than in previous trials. Within each group, adherence was significantly associated with weight loss [42].  In the largest trial to date, 811 overweight and obese adults were randomly assigned to one of four diets based upon macronutrient content: low or high fat (20 to 40 percent), which provided carbohydrate at 35, 45, 55, or 65 percent, and high or average protein (15 to 25 percent) [40]. After six months, mean weight loss in each group was 6 kg. By two years, mean weight loss was 3 to 4 kg, and weight losses remained similar in all groups. Many  participants had trouble attaining target levels of macronutrients. Subjects who attended the greatest number of group sessions (most adherent) lost the most weight. Thus, any diet that is adhered to will produce modest weight loss, but adherence rates are low with most diets. Although a low-carbohydrate diet may be associated with greater short-term weight loss, superior weight loss in the long-term has not been established. The optimal mix of macronutrients likely depends upon individual factors [43]. A principal determinant of weight loss appears to be the degree of adherence to the diet, irrespective of the particular macronutrient composition [37,39,40,42,44,45]. Thus, we suggest choosing a macronutrient mix based upon patient preferences, which may improve long-term adherence. Behavioral modification to improve dietary compliance with any type of diet may have the greatest impact on long-term weight loss. (See "Behavioral strategies in the treatment of obesity".) Lipids - The observed effects on blood lipids were similar for trials comparing low fat and very low carbohydrate diets [22,33-36,46]; the low-carbohydrate/high-fat diets caused slight increases in HDL, and greater decreases in fasting triglycerides. At 12 to 24 months, however, the favorable effects on HDL persisted [34,36,37,41], while triglyceride levels were either reduced [34,36] or returned to baseline [37]. In a meta-analysis of trials comparing low-carbohydrate and low-fat diet groups, LDL levels were increased in the low-carbohydrate group [22]. There was no clear benefit of either low-fat or low-carbohydrate diet on cardiovascular risks. Favorable changes in HDL cholesterol and triglycerides should be weighed against potential unfavorable changes in LDL cholesterol.  Side effects - Very low-carbohydrate diets may be associated with more frequent side effects than low-fat diets. In one of the trials noted above, a number of symptoms  occurred significantly more frequently in the low-carbohydrate compared to the low-fat diet group [33]. These included constipation (68 versus 35 percent), headache (60 versus 40 percent), halitosis (38 versus 8 percent), muscle cramps (35 versus 7 percent), diarrhea (23 versus 7 percent), general weakness (25 versus 8 percent), and rash (13 versus 0 percent) [33]. Despite the higher rate of symptoms, dropout rates in clinical trials have been similar for low-carbohydrate and low-fat diets [34-36]. Some have raised the concern about ketosis that occurs with very low-carbohydrate diets. There is one case report of an obese patient who presented in severe ketoacidosis, having lost 9 kg in one month on the Atkins diet, with intake restricted to meat, cheese, and salads [47]. Aside from her diet and possible mild dehydration due to gastroenteritis, no other cause for her ketoacidosis was identified. Weight maintenance diets - Although many individuals have success losing weight with diet, most subsequently regain much or all of the lost weight. Maintaining weight loss is made difficult by the reduction in energy expenditure that is induced by weight loss. In addition, long-term adherence to restrictive diets is difficult. Exercise and behavioral interventions may help individuals maintain weight loss. These strategies are reviewed in detail elsewhere. (See "Role of physical activity and exercise in obesity", section on 'Maintenance of weight loss' and "Behavioral strategies in the treatment of obesity", section  on 'Maintenance of weight loss'.) There is little consensus on the optimal mix of macronutrients to maintain weight loss. The satiating effects of high protein, low glycemic index diets have generated interest in manipulating protein composition and glycemic index in weight maintenance diets. (See 'High protein diets' above and "Dietary carbohydrates", section on 'Effect of glycemic index/glycemic load'.) In a  multicenter trial of five ad libitum diets to prevent weight regain over 26 weeks, 773 adults who had successfully lost 8 percent of their body weight on a low calorie diet (800 to 1000 kcal/day), were randomly assigned in a two-by-two factorial design to a high or low-protein (25 versus 13 percent of total calories), high or low-glycemic index, or to a control diet (moderate protein content) [48]. All diets had a moderate fat content (25 to 30 percent). The achieved protein content was 5 percentage points higher in the high versus low protein groups, and the mean glycemic index was five units lower in the low-glycemic versus high-glycemic index groups. In the intention-to-treat analysis, weight regain during the trial was modestly but significantly greater in the low versus high-protein groups (mean difference 0.93 kg) and in the high versus low-glycemic index groups (mean difference 0.95 kg). Only subjects in the high-protein, low-glycemic index diet group continued to lose weight (mean change -0.38 kg). The trial was limited by the moderate dropout rate (29 percent) and short-term follow-up (six months). Whether a low glycemic index, high protein diet is associated with long-term weight maintenance is unknown. As discussed above, long-term adherence to a weight maintaining diet is probably the most important determinant of success, and therefore the optimal weight maintaining diet will depend upon preference and individual factors. Role of dietary counseling - Dietary counseling may produce modest, short-term weight losses. This topic is reviewed in detail elsewhere. (See "Behavioral strategies in the treatment of obesity", section on 'Elements of behavioral strategies' and "Behavioral strategies in the treatment of obesity", section on 'Efficacy'.)  Prolonged caloric restriction and longevity - Prolonged caloric restriction improves longevity in rodents and non-human primates [49], but it is not known if the  same is true in humans. It is hypothesized that the antiaging effects of caloric restriction are due to reduced energy expenditure resulting in a reduction in production of reactive oxygen species (and therefore a reduction in oxidative damage). In addition, other metabolic effects associated with caloric restriction, such as improved insulin sensitivity, might also have an antiaging effect. In one trial of 48 sedentary, overweight men and women, six months of caloric restriction, with or without exercise, resulted in significant weight loss as expected [50]. In addition, calorie restriction-mediated reductions in fasting insulin concentrations, core body temperature, serum T3 levels, and oxidative damage to DNA (as reflected by a reduction in DNA fragmentation) were seen, suggesting a possible antiaging effect of the prolonged caloric restriction. INFORMATION FOR PATIENTS - UpToDate offers two types of patient education materials, "The Basics" and "Beyond the Basics." The Basics patient education pieces are written in plain language, at the 5th to 6th grade reading level, and they answer the four or five key questions a patient might have about a given condition. These articles are best for patients who want a general overview and who prefer short, easy-to-read materials. Beyond the Basics patient education pieces are longer, more sophisticated, and more detailed. These articles are written at the 10th to 12th grade reading level and are best for patients who want in-depth information and are comfortable with some medical jargon. Here are the patient  education articles that are relevant to this topic. We encourage you to print or e-mail these topics to your patients. (You can also locate patient education articles on a variety of subjects by searching on "patient info" and the keyword(s) of interest.)  Basics topics (see "Patient information: Diet and health (The Basics)" and "Patient information: Weight loss  treatments (The Basics)")  Beyond the Basics topics (see "Patient information: Diet and health (Beyond the Basics)" and "Patient information: Weight loss treatments (Beyond the Basics)" and "Patient information: Weight loss surgery (Beyond the Basics)")  SUMMARY AND RECOMMENDATIONS An initial weight loss goal of 5 to 7 percent of body weight is realistic for most individuals. (See 'Goals of weight loss' above.)  Many types of diets produce modest weight loss. Options include balanced low-calorie, low-fat low-calorie, moderate-fat low calorie, low-carbohydrate diets, and the Mediterranean diet. Dietary adherence is an important predictor of weight loss, irrespective of the type of diet. (See 'Types of diets' above.)  We suggest tailoring a diet that reduces energy intake below energy expenditure to individual patient preferences, rather than focusing on the macronutrient composition of the diet (Grade 2B). (See 'Comparison trials' above.)  If a low-carbohydrate diet is chosen, healthy choices for fat (mono and polyunsaturated) and protein (fish, nuts, legumes, and poultry) should be encouraged. If a low-fat diet is chosen, the decrease in fat should be accompanied by increases in healthy carbohydrates (fruits, vegetables, whole grains).  Exercise to Lose Weight Exercise and a healthy diet may help you lose weight. Your doctor may suggest specific exercises. EXERCISE IDEAS AND TIPS Choose low-cost things you enjoy doing, such as walking, bicycling, or exercising to workout videos.  Take stairs instead of the elevator.  Walk during your lunch break.  Park your car further away from work or school.  Go to a gym or an exercise class.  Start with 5 to 10 minutes of exercise each day. Build up to 30 minutes of exercise 4 to 6 days a week.  Wear shoes with good support and comfortable clothes.  Stretch before and after working out.  Work out until you breathe harder and your heart beats faster.  Drink  extra water when you exercise.  Do not do so much that you hurt yourself, feel dizzy, or get very short of breath.  Exercises that burn about 150 calories: Running 1  miles in 15 minutes.  Playing volleyball for 45 to 60 minutes.  Washing and waxing a car for 45 to 60 minutes.  Playing touch football for 45 minutes.  Walking 1  miles in 35 minutes.  Pushing a stroller 1  miles in 30 minutes.  Playing basketball for 30 minutes.  Raking leaves for 30 minutes.  Bicycling 5 miles in 30 minutes.  Walking 2 miles in 30 minutes.  Dancing for 30 minutes.  Shoveling snow for 15 minutes.  Swimming laps for 20 minutes.  Walking up stairs for 15 minutes.  Bicycling 4 miles in 15 minutes.  Gardening for 30 to 45 minutes.  Jumping rope for 15 minutes.  Washing windows or floors for 45 to 60 minutes.  Document Released: 10/25/2010 Document Revised: 06/04/2011 Document Reviewed: 10/25/2010 [facility] Patient Information 2012 [place], [place].

## 2011-09-26 NOTE — Progress Notes (Signed)
[name] March 08, 1961 [id number]   History:    50 y.o.  for annual exam with no complaints today. Review of her record again she still weighing 222 pounds has a BMI of 39.5. Patient with prior history of total abdominal hysterectomy. She has informed me that 15 years ago she had a cervical cone biopsy for dysplasia. She denies any vasomotor symptoms. She's been followed by Dr. Concepcion Elk for her hypertension and had all her lab work drawn this morning. She has not had baseline colonoscopy yet.  Past medical history,surgical history, family history and social history were all reviewed and documented in the EPIC chart.  Gynecologic History Patient's last menstrual period was 05/08/2009. Contraception: Hysterectomy Last Pap: 2011. Results were: normal Last mammogram: October 2012 diagnostic mammogram of the left breast schedule for screening mammogram next month. Results were: Fiber glandular tissue otherwise benign.  Obstetric History OB History    Grav Para Term Preterm Abortions TAB SAB Ect Mult Living   2 2 2       2      # Outc Date GA Lbr Len/2nd Wgt Sex Del Anes PTL Lv   1 TRM     F SVD  No Yes   2 TRM     F CS  No Yes       ROS:  Was performed and pertinent positives and negatives are included in the history.  Exam: chaperone present  BP 128/84  Ht 5\' 3"  (1.6 m)  Wt 223 lb (101.152 kg)  BMI 39.50 kg/m2  LMP 05/08/2009  Body mass index is 39.50 kg/(m^2).  General appearance : Well developed well nourished female. No acute distress HEENT: Neck supple, trachea midline, no carotid bruits, no thyroidmegaly Lungs: Clear to auscultation, no rhonchi or wheezes, or rib retractions  Heart: Regular rate and rhythm, no murmurs or gallops Breast:Examined in sitting and supine position were symmetrical in appearance, no palpable masses or tenderness,  no skin retraction, no nipple inversion, no nipple discharge, no skin discoloration, no axillary or supraclavicular  lymphadenopathy Abdomen: no palpable masses or tenderness, no rebound or guarding Extremities: no edema or skin discoloration or tenderness  Pelvic:  Bartholin, Urethra, Skene Glands: Within normal limits             Vagina: No gross lesions or discharge  Cervix: Absent  Uterus absent  Adnexa  Without masses or tenderness  Anus and perineum  normal   Rectovaginal  normal sphincter tone without palpated masses or tenderness             Hemoccult obtained results pending at time of this dictation     Assessment/Plan:  50 y.o. female for annual exam overweight. We discussed importance of regular exercise and good nutrition. Some reference guidelines were provided today to assist her. She was instructed to continue her monthly self breast examination. I've given her name of one of my colleagues in the community to schedule for screening colonoscopy. A Pap smear and fecal occult blood testing was done today. We discussed the new screening guidelines her Pap smears but since she has had dysplasia 15 years ago and a followup Pap smears been negative we'll continue to follow for 5 more years to follow the new guidelines. Patient refused flu vaccine today. She denies any vasomotor symptoms at the present time. We discussed also importance of calcium vitamin D for osteoporosis prevention as well.    Ok Edwards MD, 3:34 PM 09/26/2011

## 2011-10-23 ENCOUNTER — Ambulatory Visit: Payer: BC Managed Care – PPO

## 2011-10-24 ENCOUNTER — Ambulatory Visit: Payer: BC Managed Care – PPO

## 2011-10-31 ENCOUNTER — Ambulatory Visit: Payer: BC Managed Care – PPO

## 2011-11-11 ENCOUNTER — Ambulatory Visit
Admission: RE | Admit: 2011-11-11 | Discharge: 2011-11-11 | Disposition: A | Discharge status: Home / Self Care | Payer: BC Managed Care – PPO | Source: Ambulatory Visit | Attending: Gynecology | Admitting: Gynecology

## 2011-11-11 DIAGNOSIS — Z1231 Encounter for screening mammogram for malignant neoplasm of breast: Secondary | ICD-10-CM

## 2011-11-24 ENCOUNTER — Encounter (HOSPITAL_COMMUNITY): Payer: Self-pay | Admitting: *Deleted

## 2011-11-24 ENCOUNTER — Emergency Department (INDEPENDENT_AMBULATORY_CARE_PROVIDER_SITE_OTHER)
Admission: EM | Admit: 2011-11-24 | Discharge: 2011-11-24 | Disposition: A | Discharge status: Home / Self Care | Payer: BC Managed Care – PPO | Source: Home / Self Care | Attending: Emergency Medicine | Admitting: Emergency Medicine

## 2011-11-24 ENCOUNTER — Emergency Department (INDEPENDENT_AMBULATORY_CARE_PROVIDER_SITE_OTHER): Payer: BC Managed Care – PPO

## 2011-11-24 DIAGNOSIS — M549 Dorsalgia, unspecified: Secondary | ICD-10-CM

## 2011-11-24 MED ORDER — MELOXICAM 7.5 MG PO TABS: 7.5000 mg | ORAL_TABLET | Freq: Every day | ORAL | Status: AC

## 2011-11-24 MED ORDER — IBUPROFEN 800 MG PO TABS
ORAL_TABLET | ORAL | Status: AC
  Filled 2011-11-24: qty 1

## 2011-11-24 MED ORDER — IBUPROFEN 800 MG PO TABS
800.0000 mg | ORAL_TABLET | Freq: Once | ORAL | Status: AC
  Administered 2011-11-24: 800 mg via ORAL

## 2011-11-24 MED ORDER — CYCLOBENZAPRINE HCL 10 MG PO TABS: 10.0000 mg | ORAL_TABLET | Freq: Two times a day (BID) | ORAL | Status: AC | PRN

## 2011-11-24 NOTE — Discharge Instructions (Signed)
As discussed during your exam if pain persists beyond 5-7 days followup with her primary care doctor or the orthopedic provider occasionally further evaluation might be needed or try to physical therapy. Take his medicines as prescribed certain restrictions apply to manage her pain as you are allergic/or have expressed some reactions to multiple medicines. Be aware when you take this medicine should not take over-the-counter medicines as Advil Aleve or other anti-inflammatory medicines. I have also explained to you that we will contact you if official x-ray interpretation to notes any other abnormalities.   Back Pain, Adult Low back pain is very common. About 1 in 5 people have back pain.The cause of low back pain is rarely dangerous. The pain often gets better over time.About half of people with a sudden onset of back pain feel better in just 2 weeks. About 8 in 10 people feel better by 6 weeks.  CAUSES Some common causes of back pain include:  Strain of the muscles or ligaments supporting the spine.   Wear and tear (degeneration) of the spinal discs.   Arthritis.   Direct injury to the back.  DIAGNOSIS Most of the time, the direct cause of low back pain is not known.However, back pain can be treated effectively even when the exact cause of the pain is unknown.Answering your caregiver's questions about your overall health and symptoms is one of the most accurate ways to make sure the cause of your pain is not dangerous. If your caregiver needs more information, he or she may order lab work or imaging tests (X-rays or MRIs).However, even if imaging tests show changes in your back, this usually does not require surgery. HOME CARE INSTRUCTIONS For many people, back pain returns.Since low back pain is rarely dangerous, it is often a condition that people can learn to [facility] their own.   Remain active. It is stressful on the back to sit or stand in one place. Do not sit, drive, or stand in  one place for more than 30 minutes at a time. Take short walks on level surfaces as soon as pain allows.Try to increase the length of time you walk each day.   Do not stay in bed.Resting more than 1 or 2 days can delay your recovery.   Do not avoid exercise or work.Your body is made to move.It is not dangerous to be active, even though your back may hurt.Your back will likely heal faster if you return to being active before your pain is gone.   Pay attention to your body when you bend and lift. Many people have less discomfortwhen lifting if they bend their knees, keep the load close to their bodies,and avoid twisting. Often, the most comfortable positions are those that put less stress on your recovering back.   Find a comfortable position to sleep. Use a firm mattress and lie on your side with your knees slightly bent. If you lie on your back, put a pillow under your knees.   Only take over-the-counter or prescription medicines as directed by your caregiver. Over-the-counter medicines to reduce pain and inflammation are often the most helpful.Your caregiver may prescribe muscle relaxant drugs.These medicines help dull your pain so you can more quickly return to your normal activities and healthy exercise.   Put ice on the injured area.   Put ice in a plastic bag.   Place a towel between your skin and the bag.   Leave the ice on for 15 to 20 minutes, 3 to 4 times  a day for the first 2 to 3 days. After that, ice and heat may be alternated to reduce pain and spasms.   Ask your caregiver about trying back exercises and gentle massage. This may be of some benefit.   Avoid feeling anxious or stressed.Stress increases muscle tension and can worsen back pain.It is important to recognize when you are anxious or stressed and learn ways to manage it.Exercise is a great option.  SEEK MEDICAL CARE IF:  You have pain that is not relieved with rest or medicine.   You have pain that does  not improve in 1 week.   You have new symptoms.   You are generally not feeling well.  SEEK IMMEDIATE MEDICAL CARE IF:   You have pain that radiates from your back into your legs.   You develop new bowel or bladder control problems.   You have unusual weakness or numbness in your arms or legs.   You develop nausea or vomiting.   You develop abdominal pain.   You feel faint.  Document Released: 09/22/2005 Document Revised: 06/04/2011 Document Reviewed: 02/10/2011 [facility] Patient Information 2012 [place], [place].

## 2011-11-24 NOTE — ED Provider Notes (Signed)
History     CSN: [id number]  Arrival date & time 11/24/11  1203   First MD Initiated Contact with Patient 11/24/11 1218      Chief Complaint  Patient presents with  . Back Pain    (Consider location/radiation/quality/duration/timing/severity/associated sxs/prior treatment) HPI Comments: Patient presents today at urgent care complaining of lower back pain for about 3 days. This morning was much worse decided to come in for a check. Patient denies having had any recent injuries or falls. (points lower lumbar area). Patient denies any other symptoms as numbness, lower extremity weakness, no changes in urination pattern, no fevers.  Patient describes as leaning forward exacerbates her pain.  Patient is a 51 y.o. female presenting with back pain. The history is provided by the patient and the spouse.  Back Pain  This is a new problem. The current episode started more than 2 days ago. The problem occurs constantly. The problem has been gradually worsening. The pain is associated with no known injury. The pain is present in the lumbar spine. The quality of the pain is described as aching. The pain is at a severity of 8/10. The pain is moderate. The symptoms are aggravated by bending, twisting and certain positions. Stiffness is present all day. Pertinent negatives include no fever, no numbness, no bowel incontinence, no perianal numbness, no pelvic pain, no leg pain, no paresthesias, no paresis, no tingling and no weakness.    Past Medical History  Diagnosis Date  . TIA (transient ischemic attack) 2000  . Panic attacks   . Hypertension     Past Surgical History  Procedure Date  . Cardiac catheterization 07/2007  . Abdominal hysterectomy 11/20/2008    TAH/  . Cesarean section 1982  . Cholecystectomy 1990  . Ovarian cyst removal 2007    RIGHT CYST.. BY LAPAROSCOPY  . Cervix lesion destruction   . Pelvic laparoscopy 12/17/2006    RIGHT OVARIAN TORSION    Family History  Problem  Relation Age of Onset  . Hypertension Mother   . Cancer Mother     UTERINE  . Hypertension Father   . Cancer Maternal Grandmother     LUNG  . Diabetes Maternal Grandmother   . Hypertension Maternal Grandmother   . Cancer Paternal Grandmother     UTERINE  . Hypertension Paternal Grandmother   . Hypertension Maternal Grandfather   . Hypertension Paternal Grandfather     History  Substance Use Topics  . Smoking status: Former Smoker    Quit date: 09/25/1997  . Smokeless tobacco: Never Used  . Alcohol Use: No     rare    OB History    Grav Para Term Preterm Abortions TAB SAB Ect Mult Living   2 2 2       2       Review of Systems  Constitutional: Negative for fever, chills, diaphoresis, activity change and fatigue.  HENT: Negative for neck pain and neck stiffness.   Gastrointestinal: Negative for bowel incontinence.  Genitourinary: Negative for pelvic pain.  Musculoskeletal: Positive for back pain. Negative for joint swelling and gait problem.  Neurological: Negative for tingling, weakness, numbness and paresthesias.    Allergies  Codeine; Dilaudid; Percocet; and Zofran  Home Medications   Current Outpatient Rx  Name Route Sig Dispense Refill  . ALPRAZOLAM 0.5 MG PO TABS Oral Take 0.5 mg by mouth at bedtime as needed.      . IBUPROFEN 600 MG PO TABS Oral Take 600 mg by mouth every  6 (six) hours as needed.      Marland Kitchen LISINOPRIL 2.5 MG PO TABS Oral Take 2.5 mg by mouth daily.      Marland Kitchen MONTELUKAST SODIUM 10 MG PO TABS Oral Take 10 mg by mouth at bedtime.      Marland Kitchen ONE-DAILY MULTI VITAMINS PO TABS Oral Take 1 tablet by mouth daily.      Marland Kitchen OLMESARTAN MEDOXOMIL-HCTZ 20-12.5 MG PO TABS Oral Take 1 tablet by mouth daily.      Marland Kitchen VITAMIN E 400 UNITS PO CAPS Oral Take 400 Units by mouth daily.      Marland Kitchen ZOLPIDEM TARTRATE ER 12.5 MG PO TBCR Oral Take 12.5 mg by mouth at bedtime as needed.        BP 144/51  Pulse 66  Temp(Src) 98.2 F (36.8 C) (Oral)  Resp 16  SpO2 100%  LMP  05/08/2009  Physical Exam  Nursing note and vitals reviewed. Constitutional: She appears well-developed and well-nourished. No distress.  HENT:  Head: Normocephalic.  Eyes: No scleral icterus.  Abdominal: She exhibits no distension.  Skin: Skin is warm. No abrasion, no bruising, no ecchymosis, no lesion and no rash noted. No erythema.       ED Course  Procedures (including critical care time)  Labs Reviewed - No data to display No results found.   No diagnosis found.    MDM          Jimmie Molly, MD 11/24/11 1500

## 2011-11-24 NOTE — ED Notes (Signed)
Pt is here with complaints of lower back pain.  States pain is getting worse x 2-3 days.  Denies known injury or cause.

## 2012-07-31 ENCOUNTER — Emergency Department (HOSPITAL_COMMUNITY)
Admission: EM | Admit: 2012-07-31 | Discharge: 2012-07-31 | Disposition: A | Discharge status: Home / Self Care | Payer: BC Managed Care – PPO | Source: Home / Self Care | Attending: Family Medicine | Admitting: Family Medicine

## 2012-07-31 ENCOUNTER — Encounter (HOSPITAL_COMMUNITY): Payer: Self-pay | Admitting: Emergency Medicine

## 2012-07-31 DIAGNOSIS — M545 Low back pain, unspecified: Secondary | ICD-10-CM

## 2012-07-31 MED ORDER — KETOROLAC TROMETHAMINE 60 MG/2ML IM SOLN
60.0000 mg | Freq: Once | INTRAMUSCULAR | Status: AC
  Administered 2012-07-31: 60 mg via INTRAMUSCULAR

## 2012-07-31 MED ORDER — DICLOFENAC POTASSIUM 50 MG PO TABS: 50.0000 mg | ORAL_TABLET | Freq: Three times a day (TID) | ORAL | Status: DC

## 2012-07-31 MED ORDER — KETOROLAC TROMETHAMINE 60 MG/2ML IM SOLN
INTRAMUSCULAR | Status: AC
  Filled 2012-07-31: qty 2

## 2012-07-31 MED ORDER — CYCLOBENZAPRINE HCL 5 MG PO TABS: 5.0000 mg | ORAL_TABLET | Freq: Three times a day (TID) | ORAL | Status: DC | PRN

## 2012-07-31 NOTE — ED Notes (Signed)
Pt c/o left hip pain since yesterday... Pt was wearing a boot on left foot x6 months for a torn tendon in left ankle.... Sx include: pain...  Denies: fevers, vomiting, nausea, diarrhea.. Pt is alert w/no signs of distress.

## 2012-07-31 NOTE — ED Provider Notes (Signed)
History     CSN: [id number]  Arrival date & time 07/31/12  1854   First MD Initiated Contact with Patient 07/31/12 1910      Chief Complaint  Patient presents with  . Hip Pain    (Consider location/radiation/quality/duration/timing/severity/associated sxs/prior treatment) Patient is a 51 y.o. female presenting with back pain. The history is provided by the patient.  Back Pain  This is a new problem. 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 The problem has not changed since onset.The pain is present in the sacro-iliac joint. The quality of the pain is described as burning. The pain does not radiate. The pain is moderate. The symptoms are aggravated by certain positions. Pertinent negatives include no fever, no numbness, no abdominal pain, no bowel incontinence, no perianal numbness, no bladder incontinence, no dysuria, no pelvic pain, no paresthesias, no paresis, no tingling and no weakness. Risk factors include obesity and poor posture.    Past Medical History  Diagnosis Date  . TIA (transient ischemic attack) 2000  . Panic attacks   . Hypertension     Past Surgical History  Procedure Date  . Cardiac catheterization 07/2007  . Abdominal hysterectomy 11/20/2008    TAH/  . Cesarean section 1982  . Cholecystectomy 1990  . Ovarian cyst removal 2007    RIGHT CYST.. BY LAPAROSCOPY  . Cervix lesion destruction   . Pelvic laparoscopy 12/17/2006    RIGHT OVARIAN TORSION    Family History  Problem Relation Age of Onset  . Hypertension Mother   . Cancer Mother     UTERINE  . Hypertension Father   . Cancer Maternal Grandmother     LUNG  . Diabetes Maternal Grandmother   . Hypertension Maternal Grandmother   . Cancer Paternal Grandmother     UTERINE  . Hypertension Paternal Grandmother   . Hypertension Maternal Grandfather   . Hypertension Paternal Grandfather     History  Substance Use Topics  . Smoking  status: Former Smoker    Quit date: 09/25/1997  . Smokeless tobacco: Never Used  . Alcohol Use: No     rare    OB History    Grav Para Term Preterm Abortions TAB SAB Ect Mult Living   2 2 2       2       Review of Systems  Constitutional: Negative.  Negative for fever.  Gastrointestinal: Negative.  Negative for abdominal pain and bowel incontinence.  Genitourinary: Negative for bladder incontinence, dysuria and pelvic pain.  Musculoskeletal: Positive for back pain and gait problem.  Neurological: Negative for tingling, weakness, numbness and paresthesias.    Allergies  Codeine; Dilaudid; Percocet; and Zofran  Home Medications   Current Outpatient Rx  Name Route Sig Dispense Refill  . ALPRAZOLAM 0.5 MG PO TABS Oral Take 0.5 mg by mouth at bedtime as needed.      Marland Kitchen LISINOPRIL 2.5 MG PO TABS Oral Take 2.5 mg by mouth daily.      . CYCLOBENZAPRINE HCL 5 MG PO TABS Oral Take 1 tablet (5 mg total) by mouth 3 (three) times daily as needed for muscle spasms. 30 tablet 0  . DICLOFENAC POTASSIUM 50 MG PO TABS Oral Take 1 tablet (50 mg total) by mouth 3 (three) times daily. 30 tablet 0  . MELOXICAM 7.5 MG PO TABS Oral Take 1 tablet (7.5 mg total) by mouth daily. 14 tablet 0  . MONTELUKAST SODIUM 10 MG PO TABS  Oral Take 10 mg by mouth at bedtime.      Marland Kitchen ONE-DAILY MULTI VITAMINS PO TABS Oral Take 1 tablet by mouth daily.      Marland Kitchen OLMESARTAN MEDOXOMIL-HCTZ 20-12.5 MG PO TABS Oral Take 1 tablet by mouth daily.      Marland Kitchen VITAMIN E 400 UNITS PO CAPS Oral Take 400 Units by mouth daily.      Marland Kitchen ZOLPIDEM TARTRATE ER 12.5 MG PO TBCR Oral Take 12.5 mg by mouth at bedtime as needed.        BP 139/86  Pulse 87  Temp 98.6 F (37 C) (Oral)  Resp 16  SpO2 99%  LMP 05/08/2009  Physical Exam  Nursing note and vitals reviewed. Constitutional: She is oriented to person, place, and time. She appears well-developed and well-nourished.  Abdominal: Soft. Bowel sounds are normal.  Musculoskeletal: She  exhibits tenderness.       Back:  Neurological: She is alert and oriented to person, place, and time.  Skin: Skin is warm and dry.    ED Course  Procedures (including critical care time)  Labs Reviewed - No data to display No results found.   1. Lumbar back pain       MDM          Linna Hoff, MD 07/31/12 2011

## 2012-11-09 ENCOUNTER — Other Ambulatory Visit: Payer: Self-pay | Admitting: Gynecology

## 2012-11-09 DIAGNOSIS — Z1231 Encounter for screening mammogram for malignant neoplasm of breast: Secondary | ICD-10-CM

## 2012-12-08 ENCOUNTER — Ambulatory Visit
Admission: RE | Admit: 2012-12-08 | Discharge: 2012-12-08 | Disposition: A | Discharge status: Home / Self Care | Payer: BC Managed Care – PPO | Source: Ambulatory Visit | Attending: Gynecology | Admitting: Gynecology

## 2012-12-08 ENCOUNTER — Ambulatory Visit (INDEPENDENT_AMBULATORY_CARE_PROVIDER_SITE_OTHER): Payer: BC Managed Care – PPO | Admitting: Gynecology

## 2012-12-08 ENCOUNTER — Other Ambulatory Visit (HOSPITAL_COMMUNITY)
Admission: RE | Admit: 2012-12-08 | Discharge: 2012-12-08 | Disposition: A | Discharge status: Home / Self Care | Payer: BC Managed Care – PPO | Source: Ambulatory Visit | Attending: Gynecology | Admitting: Gynecology

## 2012-12-08 ENCOUNTER — Encounter: Payer: Self-pay | Admitting: Gynecology

## 2012-12-08 VITALS — BP 148/96 | Ht 62.5 in | Wt 240.0 lb

## 2012-12-08 DIAGNOSIS — L293 Anogenital pruritus, unspecified: Secondary | ICD-10-CM

## 2012-12-08 DIAGNOSIS — Z1151 Encounter for screening for human papillomavirus (HPV): Secondary | ICD-10-CM | POA: Insufficient documentation

## 2012-12-08 DIAGNOSIS — Z87410 Personal history of cervical dysplasia: Secondary | ICD-10-CM

## 2012-12-08 DIAGNOSIS — Z9889 Other specified postprocedural states: Secondary | ICD-10-CM

## 2012-12-08 DIAGNOSIS — Z780 Asymptomatic menopausal state: Secondary | ICD-10-CM

## 2012-12-08 DIAGNOSIS — Z01419 Encounter for gynecological examination (general) (routine) without abnormal findings: Secondary | ICD-10-CM | POA: Insufficient documentation

## 2012-12-08 LAB — WET PREP FOR TRICH, YEAST, CLUE
Clue Cells Wet Prep HPF POC: NONE SEEN
Trich, Wet Prep: NONE SEEN
WBC, Wet Prep HPF POC: NONE SEEN

## 2012-12-08 MED ORDER — NYSTATIN-TRIAMCINOLONE 100000-0.1 UNIT/GM-% EX CREA: TOPICAL_CREAM | Freq: Three times a day (TID) | CUTANEOUS | Status: DC

## 2012-12-08 NOTE — Progress Notes (Signed)
Merrissa J. Cutler-Fosque 1960/10/14 [id number]   History:    52 y.o.  for annual gyn exam and was complaining of vulvar pruritus. Review of patient's records indicated that she has not yet done her colonoscopy which is overdue or her bone density study. Her mammogram was in March of this year which was normal. Patient denies any prior abnormal Pap smears. Patient with past history of abdominal hysterectomy and right salpingo-oophorectomy. She was complaining of some weight gain.  Review of her record indicated that she is seeing Dr. York Ram in 2008 and had a cardiac catheterization. Patient also has informed me that she has had history of cervical conization for high-grade dysplasia several years ago before she moved to the area. Patient followup afterwards were normal. And pathology report from her total abdominal hysterectomy demonstrated no dysplasia on her cervix. Her primary physician has been doing her lab work and is treating her for hypertension.  Past medical history,surgical history, family history and social history were all reviewed and documented in the EPIC chart.  Gynecologic History Patient's last menstrual period was 05/08/2009. Contraception: status post hysterectomy Last Pap: 2012. Results were: normal Last mammogram: 2014. Results were: normal  Obstetric History OB History   Grav Para Term Preterm Abortions TAB SAB Ect Mult Living   2 2 2       2      # Outc Date GA Lbr Len/2nd Wgt Sex Del Anes PTL Lv   1 TRM     F SVD  No Yes   2 TRM     F CS  No Yes       ROS: A ROS was performed and pertinent positives and negatives are included in the history.  GENERAL: No fevers or chills. HEENT: No change in vision, no earache, sore throat or sinus congestion. NECK: No pain or stiffness. CARDIOVASCULAR: No chest pain or pressure. No palpitations. PULMONARY: No shortness of breath, cough or wheeze. GASTROINTESTINAL: No abdominal pain, nausea, vomiting or diarrhea, melena or  bright red blood per rectum. GENITOURINARY: No urinary frequency, urgency, hesitancy or dysuria. MUSCULOSKELETAL: No joint or muscle pain, no back pain, no recent trauma. DERMATOLOGIC: No rash, no itching, no lesions. ENDOCRINE: No polyuria, polydipsia, no heat or cold intolerance. No recent change in weight. HEMATOLOGICAL: No anemia or easy bruising or bleeding. NEUROLOGIC: No headache, seizures, numbness, tingling or weakness. PSYCHIATRIC: No depression, no loss of interest in normal activity or change in sleep pattern.     Exam: chaperone present  BP 148/96  Ht 5' 2.5" (1.588 m)  Wt 240 lb (108.863 kg)  BMI 43.17 kg/m2  LMP 05/08/2009  Body mass index is 43.17 kg/(m^2).  General appearance : Well developed well nourished female. No acute distress HEENT: Neck supple, trachea midline, no carotid bruits, no thyroidmegaly Lungs: Clear to auscultation, no rhonchi or wheezes, or rib retractions  Heart: Regular rate and rhythm, no murmurs or gallops Breast:Examined in sitting and supine position were symmetrical in appearance, no palpable masses or tenderness,  no skin retraction, no nipple inversion, no nipple discharge, no skin discoloration, no axillary or supraclavicular lymphadenopathy Abdomen: no palpable masses or tenderness, no rebound or guarding Extremities: no edema or skin discoloration or tenderness  Pelvic:  Bartholin, Urethra, Skene Glands: Within normal limits             Vagina: No gross lesions or discharge  Cervix:absent  Uterus  Absent  Adnexa  Without masses or tenderness  Anus and perineum  normal  Rectovaginal  normal sphincter tone without palpated masses or tenderness             Hemoccult cards provided   Wet prep was negative  Assessment/Plan:  52 y.o. female for annual exam for nonspecific vulvar irritation she is going to be place on mytrex cream to apply 2-3 times a day for the next 5-7 days. I have given her the name of GI colleagues for her to schedule  a colonoscopy. A requisition for to schedule her bone density was provided. We discussed importance of monthly self breast examination. We also discussed importance of calcium vitamin D and regular exercise for osteoporosis prevention. She is going to check with her primary physician if her Tdap has been done within the past 10 years. We discussed the next 2 year she may need her shingles vaccine. Pap smear was done today.    Ok Edwards MD, 5:23 PM 12/08/2012

## 2012-12-08 NOTE — Patient Instructions (Addendum)

## 2012-12-17 ENCOUNTER — Encounter: Payer: Self-pay | Admitting: Gynecology

## 2014-01-16 ENCOUNTER — Other Ambulatory Visit: Payer: Self-pay

## 2014-01-16 DIAGNOSIS — Z1231 Encounter for screening mammogram for malignant neoplasm of breast: Secondary | ICD-10-CM

## 2014-02-15 ENCOUNTER — Ambulatory Visit: Payer: BC Managed Care – PPO

## 2014-06-07 ENCOUNTER — Ambulatory Visit
Admission: RE | Admit: 2014-06-07 | Discharge: 2014-06-07 | Disposition: A | Discharge status: Home / Self Care | Payer: BC Managed Care – PPO | Source: Ambulatory Visit

## 2014-06-07 DIAGNOSIS — Z1231 Encounter for screening mammogram for malignant neoplasm of breast: Secondary | ICD-10-CM

## 2014-06-21 ENCOUNTER — Encounter: Payer: Self-pay | Admitting: Gynecology

## 2014-06-21 ENCOUNTER — Ambulatory Visit (INDEPENDENT_AMBULATORY_CARE_PROVIDER_SITE_OTHER): Payer: BC Managed Care – PPO | Admitting: Gynecology

## 2014-06-21 ENCOUNTER — Other Ambulatory Visit (HOSPITAL_COMMUNITY)
Admission: RE | Admit: 2014-06-21 | Discharge: 2014-06-21 | Disposition: A | Discharge status: Home / Self Care | Payer: BC Managed Care – PPO | Source: Ambulatory Visit | Attending: Gynecology | Admitting: Gynecology

## 2014-06-21 VITALS — BP 134/86 | Ht 62.5 in | Wt 244.0 lb

## 2014-06-21 DIAGNOSIS — N898 Other specified noninflammatory disorders of vagina: Secondary | ICD-10-CM

## 2014-06-21 DIAGNOSIS — Z01419 Encounter for gynecological examination (general) (routine) without abnormal findings: Secondary | ICD-10-CM

## 2014-06-21 DIAGNOSIS — Z87410 Personal history of cervical dysplasia: Secondary | ICD-10-CM

## 2014-06-21 DIAGNOSIS — Z113 Encounter for screening for infections with a predominantly sexual mode of transmission: Secondary | ICD-10-CM

## 2014-06-21 DIAGNOSIS — N952 Postmenopausal atrophic vaginitis: Secondary | ICD-10-CM

## 2014-06-21 DIAGNOSIS — L293 Anogenital pruritus, unspecified: Secondary | ICD-10-CM

## 2014-06-21 LAB — WET PREP FOR TRICH, YEAST, CLUE: TRICH WET PREP: NONE SEEN

## 2014-06-21 MED ORDER — FLUCONAZOLE 150 MG PO TABS: ORAL_TABLET | ORAL | Status: DC

## 2014-06-21 MED ORDER — TINIDAZOLE 500 MG PO TABS: ORAL_TABLET | ORAL | Status: DC

## 2014-06-21 NOTE — Patient Instructions (Addendum)
Tdap Vaccine (Tetanus, Diphtheria, Pertussis): What You Need to Know 1. Why get vaccinated? Tetanus, diphtheria and pertussis can be very serious diseases, even for adolescents and adults. Tdap vaccine can protect us from these diseases. TETANUS (Lockjaw) causes painful muscle tightening and stiffness, usually all over the body.  It can lead to tightening of muscles in the head and neck so you can't open your mouth, swallow, or sometimes even breathe. Tetanus kills about 1 out of 5 people who are infected. DIPHTHERIA can cause a thick coating to form in the back of the throat.  It can lead to breathing problems, paralysis, heart failure, and death. PERTUSSIS (Whooping Cough) causes severe coughing spells, which can cause difficulty breathing, vomiting and disturbed sleep.  It can also lead to weight loss, incontinence, and rib fractures. Up to 2 in 100 adolescents and 5 in 100 adults with pertussis are hospitalized or have complications, which could include pneumonia or death. These diseases are caused by bacteria. Diphtheria and pertussis are spread from person to person through coughing or sneezing. Tetanus enters the body through cuts, scratches, or wounds. Before vaccines, the [place] saw as many as 200,000 cases a year of diphtheria and pertussis, and hundreds of cases of tetanus. Since vaccination began, tetanus and diphtheria have dropped by about 99% and pertussis by about 80%. 2. Tdap vaccine Tdap vaccine can protect adolescents and adults from tetanus, diphtheria, and pertussis. One dose of Tdap is routinely given at age 11 or 12. People who did not get Tdap at that age should get it as soon as possible. Tdap is especially important for health care professionals and anyone having close contact with a baby younger than 12 months. Pregnant women should get a dose of Tdap during every pregnancy, to protect the newborn from pertussis. Infants are most at risk for severe, life-threatening  complications from pertussis. A similar vaccine, called Td, protects from tetanus and diphtheria, but not pertussis. A Td booster should be given every 10 years. Tdap may be given as one of these boosters if you have not already gotten a dose. Tdap may also be given after a severe cut or burn to prevent tetanus infection. Your doctor can give you more information. Tdap may safely be given at the same time as other vaccines. 3. Some people should not get this vaccine  If you ever had a life-threatening allergic reaction after a dose of any tetanus, diphtheria, or pertussis containing vaccine, OR if you have a severe allergy to any part of this vaccine, you should not get Tdap. Tell your doctor if you have any severe allergies.  If you had a coma, or long or multiple seizures within 7 days after a childhood dose of DTP or DTaP, you should not get Tdap, unless a cause other than the vaccine was found. You can still get Td.  Talk to your doctor if you:  have epilepsy or another nervous system problem,  had severe pain or swelling after any vaccine containing diphtheria, tetanus or pertussis,  ever had Guillain-Barr Syndrome (GBS),  aren't feeling well on the day the shot is scheduled. 4. Risks of a vaccine reaction With any medicine, including vaccines, there is a chance of side effects. These are usually mild and go away on their own, but serious reactions are also possible. Brief fainting spells can follow a vaccination, leading to injuries from falling. Sitting or lying down for about 15 minutes can help prevent these. Tell your doctor if you feel   dizzy or light-headed, or have vision changes or ringing in the ears. Mild problems following Tdap (Did not interfere with activities)  Pain where the shot was given (about 3 in 4 adolescents or 2 in 3 adults)  Redness or swelling where the shot was given (about 1 person in 5)  Mild fever of at least 100.51F (up to about 1 in 25 adolescents or  1 in 100 adults)  Headache (about 3 or 4 people in 10)  Tiredness (about 1 person in 3 or 4)  Nausea, vomiting, diarrhea, stomach ache (up to 1 in 4 adolescents or 1 in 10 adults)  Chills, body aches, sore joints, rash, swollen glands (uncommon) Moderate problems following Tdap (Interfered with activities, but did not require medical attention)  Pain where the shot was given (about 1 in 5 adolescents or 1 in 100 adults)  Redness or swelling where the shot was given (up to about 1 in 16 adolescents or 1 in 25 adults)  Fever over 102F (about 1 in 100 adolescents or 1 in 250 adults)  Headache (about 3 in 20 adolescents or 1 in 10 adults)  Nausea, vomiting, diarrhea, stomach ache (up to 1 or 3 people in 100)  Swelling of the entire arm where the shot was given (up to about 3 in 100). Severe problems following Tdap (Unable to perform usual activities; required medical attention)  Swelling, severe pain, bleeding and redness in the arm where the shot was given (rare). A severe allergic reaction could occur after any vaccine (estimated less than 1 in a million doses). 5. What if there is a serious reaction? What should I look for?  Look for anything that concerns you, such as signs of a severe allergic reaction, very high fever, or behavior changes. Signs of a severe allergic reaction can include hives, swelling of the face and throat, difficulty breathing, a fast heartbeat, dizziness, and weakness. These would start a few minutes to a few hours after the vaccination. What should I do?  If you think it is a severe allergic reaction or other emergency that can't wait, call 9-1-1 or get the person to the nearest hospital. Otherwise, call your doctor.  Afterward, the reaction should be reported to the "Vaccine Adverse Event Reporting System" (VAERS). Your doctor might file this report, or you can do it yourself through the VAERS web site at www.vaers.SamedayNews.es, or by calling  (418)240-0584. VAERS is only for reporting reactions. They do not give medical advice.  6. The National Vaccine Injury Compensation Program The [employer] Vaccine Injury Compensation Program (VICP) is a federal program that was created to compensate people who may have been injured by certain vaccines. Persons who believe they may have been injured by a vaccine can learn about the program and about filing a claim by calling 805-602-7765 or visiting the [employer] website at GoldCloset.com.ee. 7. How can I learn more?  Ask your doctor.  Call your local or state health department.  Contact the Centers for Disease Control and Prevention (CDC):  Call 303-125-9458 or visit CDC's website at http://hunter.com/. CDC Tdap Vaccine VIS (02/12/12) Document Released: 03/23/2012 Document Revised: 02/06/2014 Document Reviewed: 01/04/2014 [employer] Patient Information 2015 [place], [place]. This information is not intended to replace advice given to you by your health care provider. Make sure you discuss any questions you have with your health care provider.  Bariatric Surgery Information Severe obesity is difficult to treat through diet and exercise alone. Bariatric surgery (also called weight loss surgery) is an option  for people who are severely obese and cannot lose weight by traditional means, or who suffer from serious obesity-related health problems. The surgery promotes weight loss by decreasing the absorption of food and, in some cases, by interrupting the digestive process. As in other treatments for obesity, best results are achieved with healthy eating behaviors and regular physical activity.  People who may consider bariatric surgery include those with a body mass index (BMI) above 40. Men with a BMI of 40 are about 100 lb (45 kg) overweight and women with this BMI are about 80 lb (36 kg) overweight. People with a BMI between 35 and 40 and who suffer from type 2 diabetes or  life-threatening heart and lung (cardiopulmonary) problems, such as severe sleep apnea or obesity-related heart disease, may also be candidates for surgery.  THE NORMAL DIGESTIVE PROCESS Normally, as food moves along the digestive tract, digestive juices and enzymes digest and absorb calories and nutrients. After we chew and swallow our food, it moves down the esophagus to the stomach. There a strong acid continues the digestive process. When the stomach contents move to the first portion of the small intestine (duodenum), bile and pancreatic juice speed up digestion. The jejunum and ileum are the remaining two segments of the small intestine. They complete the absorption of almost all calories and nutrients. The food particles that cannot be digested in the small intestine are stored in the large intestine until they are eliminated.  HOW DOES SURGERY PROMOTE WEIGHT LOSS? Bariatric surgery alters the digestive process. The surgery closes off parts of the stomach to make it smaller, restricting the amount of food the stomach can hold. There are two types of bariatric surgeries: restrictive surgeries and malabsorptive surgeries. Restrictive surgeries only reduce stomach size. They do not interfere with the normal digestive process. Malabsorptive surgeries combine stomach restriction with a partial bypass of the small intestine. These types of procedures create a direct connection from the stomach to the lower segment of the small intestine. The connection causes food to bypass the portions of the digestive tract that absorb calories and nutrients.Malabsorptive surgeries are the most common surgeries for weight loss. They restrict both food intake and the amount of calories and nutrients the body absorbs.  Restrictive surgeries lead to weight loss in almost all patients. But they are less successful than malabsorptive surgeries in achieving substantial, long-term weight loss. Some patients regain weight. Others  are unable to adjust their eating habits and fail to lose the desired weight. Successful results depend on the patient's willingness to adopt a long-term plan of healthy eating and regular physical activity.  RESTRICTIVE SURGERY To perform a restrictive surgery, health care providers create a small pouch at the top of the stomach where food enters from the esophagus. At first, the pouch holds about 1 oz (28 g) of food. It later expands to hold 2-3 oz (56-84 g). The lower outlet of the pouch usually has a diameter of about  inch (1.9 cm). This small outlet delays the emptying of food from the pouch and causes a feeling of fullness. As a result of this surgery, most people lose the ability to eat large amounts of food at one time. After the surgery, the person usually can eat only  to 1 c (about 2 L) of food without discomfort or nausea. Also, food has to be well chewed.  There are several types of procedures that create this pouch. Adjustable Gastric Banding In this procedure, a hollow band is placed  around the stomach near its upper end. This creates a small pouch and a narrow passage into the larger remainder of the stomach. The band is then inflated with a salt solution. It can be tightened or loosened over time to change the size of the passage by increasing or decreasing the amount of salt solution.  The band is adjusted based on feelings of hunger and weight loss. Patients decide when they need an adjustment and come to their surgeons to be evaluated. The adjustment is done as an office visit. The band is fully reversible with a second surgery if the patient changes his or her mind. There is no cutting or rerouting of the intestine.  Vertical Banded Gastroplasty This is the most common restrictive surgery for weight control. Both a band and staples are used to create a small stomach pouch. Vertical banded gastroplasty is based on the same principle of restriction as adjustable gastric banding, but  the stomach is surgically altered with the stapling. This treatment is not reversible.  About 30% of those who undergo vertical banded gastroplasty achieve normal weight. About 80% achieve some degree of weight loss. MALABSORPTIVE SURGERY Malabsorptive surgeries produce more weight loss than restrictive surgeries. And they are more effective in reversing the health problems associated with severe obesity. Patients who have malabsorptive surgeries generally lose two-thirds of their excess weight within 2 years. There are several types of malabsorptive surgeries. Each one carries its own benefits and risks.  Roux-en-Y Gastric Bypass (RGB) This surgery is the most common and successful malabsorptive surgery. First, a small stomach pouch is created to restrict food intake. Next, a y-shaped section of the small intestine is attached to the pouch. This allows food to bypass the lower stomach, the duodenum, and the first portion of jejunum. This reduces the amount of calories and nutrients the body absorbs.  Biliopancreatic diversion (BPD) In this more complicated malabsorptive surgery, portions of the stomach are removed. The small pouch that remains is connected directly to the final segment of the small intestine, completely bypassing the duodenum and the jejunum.  This procedure successfully promotes weight loss. But it is less frequently used than other types of surgery because of the high risk for nutritional deficiencies. A variation of this procedure includes a "duodenal switch." This leaves a larger portion of the stomach intact, including the valve that regulates the release of stomach contents into the small intestine (pyloric valve). It also keeps a small part of the duodenum in the digestive pathway.  WHAT ARE THE BENEFITS AND RISKS OF BARIATRIC SURGERY? General Benefits  Right after surgery, most patients lose weight quickly. They continue to lose weight for 18-24 months after the procedure.  Most patients regain 5-10% of the weight they lost, but many maintain a long-term weight loss of about 100 lb (45 kg).   Most obesity-related conditions, such as abnormal blood sugar levels, improve after the surgery.  General Risks  Infection.  Abdominal hernias.   Breakdown of the staple line.   Stretched stomach outlets.  Development of gallstones. These are clumps of cholesterol and other matter that form in the gallbladder. During quick or substantial weight loss, one's risk of developing gallstones increases.   Nutritional deficiencies. Nearly 30% of patients who have bariatric surgery develop nutritional deficiencies. These include anemia, osteoporosis, and metabolic bone disease.  A leak from any of the surgical connections (anastomoses). This is life-threatening. The more involved the surgery, the more risk involved.   Inability to lose weight or  weight gain. Patients who do not follow a strict diet will stretch out their stomach pouches and they will not lose weight.   Dumping syndrome. This occurs when stomach contents move too rapidly through the small intestine causing cramping, diarrhea, nausea, palpitations, sweating, bloating, and dizziness or fainting.  Specific Risks of Restrictive Surgeries  Vomiting. This occurs when the small stomach is overly stretched by food particles that have not been chewed well.   Band slippage and saline leakage. This may occur after adjustable gastric banding.   Band erosion into the lumen of the stomach.   Wearing away of the band and breakdown of the staple line. This may occur after vertical banded gastroplasty. In a small number of cases, stomach juices may leak into the abdomen. If this happens, an emergency surgery is needed.   Death from complications (rare). This happens in only about 1% of cases.   Stomach prolapse. Specific Risks of Malabsorptive Surgeries In addition to the risks of restrictive surgeries,  malabsorptive surgeries also carry greater risk for nutritional deficiencies. This is because the procedure causes food to bypass the duodenum and jejunum. That is where most iron and calcium are absorbed. The more extensive the bypass, the greater the risk is for complications and nutritional deficiencies, including:    Anemia.  Osteoporosis.   Metabolic bone disease. Follow-up surgeries to correct complications are needed in about 10-20% of patients.  FOR MORE INFORMATION American Society for Metabolic & Bariatric Surgery: www.asmbs.org  Weight-control Information Network (WIN): win.AmenCredit.is Document Released: 09/22/2005 Document Revised: 09/27/2013 Document Reviewed: 03/22/2013 [facility] Patient Information 2015 [place], [place]. This information is not intended to replace advice given to you by your health care provider. Make sure you discuss any questions you have with your health care provider. Monilial Vaginitis Vaginitis in a soreness, swelling and redness (inflammation) of the vagina and vulva. Monilial vaginitis is not a sexually transmitted infection. CAUSES  Yeast vaginitis is caused by yeast (candida) that is normally found in your vagina. With a yeast infection, the candida has overgrown in number to a point that upsets the chemical balance. SYMPTOMS   White, thick vaginal discharge.  Swelling, itching, redness and irritation of the vagina and possibly the lips of the vagina (vulva).  Burning or painful urination.  Painful intercourse. DIAGNOSIS  Things that may contribute to monilial vaginitis are:  Postmenopausal and virginal states.  Pregnancy.  Infections.  Being tired, sick or stressed, especially if you had monilial vaginitis in the past.  Diabetes. Good control will help lower the chance.  Birth control pills.  Tight fitting garments.  Using bubble bath, feminine sprays, douches or deodorant tampons.  Taking certain medications that kill germs  (antibiotics).  Sporadic recurrence can occur if you become ill. TREATMENT  Your caregiver will give you medication.  There are several kinds of anti monilial vaginal creams and suppositories specific for monilial vaginitis. For recurrent yeast infections, use a suppository or cream in the vagina 2 times a week, or as directed.  Anti-monilial or steroid cream for the itching or irritation of the vulva may also be used. Get your caregiver's permission.  Painting the vagina with methylene blue solution may help if the monilial cream does not work.  Eating yogurt may help prevent monilial vaginitis. HOME CARE INSTRUCTIONS   Finish all medication as prescribed.  Do not have sex until treatment is completed or after your caregiver tells you it is okay.  Take warm sitz baths.  Do not douche.  Do  not use tampons, especially scented ones.  Wear cotton underwear.  Avoid tight pants and panty hose.  Tell your sexual partner that you have a yeast infection. They should go to their caregiver if they have symptoms such as mild rash or itching.  Your sexual partner should be treated as well if your infection is difficult to eliminate.  Practice safer sex. Use condoms.  Some vaginal medications cause latex condoms to fail. Vaginal medications that harm condoms are:  Cleocin cream.  Butoconazole (Femstat).  Terconazole (Terazol) vaginal suppository.  Miconazole (Monistat) (may be purchased over the counter). SEEK MEDICAL CARE IF:   You have a temperature by mouth above 102 F (38.9 C).  The infection is getting worse after 2 days of treatment.  The infection is not getting better after 3 days of treatment.  You develop blisters in or around your vagina.  You develop vaginal bleeding, and it is not your menstrual period.  You have pain when you urinate.  You develop intestinal problems.  You have pain with sexual intercourse. Document Released: 07/02/2005 Document  Revised: 12/15/2011 Document Reviewed: 03/16/2009 [facility] Patient Information 2015 [place], [place]. This information is not intended to replace advice given to you by your health care provider. Make sure you discuss any questions you have with your health care provider. Bacterial Vaginosis Bacterial vaginosis is a vaginal infection that occurs when the normal balance of bacteria in the vagina is disrupted. It results from an overgrowth of certain bacteria. This is the most common vaginal infection in women of childbearing age. Treatment is important to prevent complications, especially in pregnant women, as it can cause a premature delivery. CAUSES  Bacterial vaginosis is caused by an increase in harmful bacteria that are normally present in smaller amounts in the vagina. Several different kinds of bacteria can cause bacterial vaginosis. However, the reason that the condition develops is not fully understood. RISK FACTORS Certain activities or behaviors can put you at an increased risk of developing bacterial vaginosis, including:  Having a new sex partner or multiple sex partners.  Douching.  Using an intrauterine device (IUD) for contraception. Women do not get bacterial vaginosis from toilet seats, bedding, swimming pools, or contact with objects around them. SIGNS AND SYMPTOMS  Some women with bacterial vaginosis have no signs or symptoms. Common symptoms include:  Grey vaginal discharge.  A fishlike odor with discharge, especially after sexual intercourse.  Itching or burning of the vagina and vulva.  Burning or pain with urination. DIAGNOSIS  Your health care provider will take a medical history and examine the vagina for signs of bacterial vaginosis. A sample of vaginal fluid may be taken. Your health care provider will look at this sample under a microscope to check for bacteria and abnormal cells. A vaginal pH test may also be done.  TREATMENT  Bacterial vaginosis may be treated  with antibiotic medicines. These may be given in the form of a pill or a vaginal cream. A second round of antibiotics may be prescribed if the condition comes back after treatment.  HOME CARE INSTRUCTIONS   Only take over-the-counter or prescription medicines as directed by your health care provider.  If antibiotic medicine was prescribed, take it as directed. Make sure you finish it even if you start to feel better.  Do not have sex until treatment is completed.  Tell all sexual partners that you have a vaginal infection. They should see their health care provider and be treated if they have problems, such  as a mild rash or itching.  Practice safe sex by using condoms and only having one sex partner. SEEK MEDICAL CARE IF:   Your symptoms are not improving after 3 days of treatment.  You have increased discharge or pain.  You have a fever. MAKE SURE YOU:   Understand these instructions.  Will watch your condition.  Will get help right away if you are not doing well or get worse. FOR MORE INFORMATION  Centers for Disease Control and Prevention, Division of STD Prevention: AppraiserFraud.fi American Sexual Health Association (ASHA): www.ashastd.org  Document Released: 09/22/2005 Document Revised: 07/13/2013 Document Reviewed: 05/04/2013 [facility] Patient Information 2015 [place], [place]. This information is not intended to replace advice given to you by your health care provider. Make sure you discuss any questions you have with your health care provider.

## 2014-06-21 NOTE — Progress Notes (Signed)
[name] [birth year] [id number]   History:    53 y.o.  for annual gyn exam complaining of vulvar pruritus. Patient had been separated from her husband for 6 months since 37 and sexually active. Patient would like to have an STD screen today.Review of patient's records indicated that she has not yet done her colonoscopy which is overdue or her bone density study. Patient with past history of abdominal hysterectomy and right salpingo-oophorectomy. She was complaining of some weight gain.Review of her record indicated that she is seeing Dr. Donnella Bi in 2008 and had a cardiac catheterization. Patient also has informed me that she has had history of cervical conization for high-grade dysplasia several years ago before she moved to the area. Patient followup afterwards were normal. And pathology report from her total abdominal hysterectomy demonstrated no dysplasia on her cervix. Her primary physician has been doing her lab work and is treating her for hypertension. Patient declined a flu vaccine today. Patient believes she has had the Tdap vaccine in the past. Patient menopausal on no hormone replacement therapy. Patient with no vasomotor symptoms. Patient with recent mammogram demonstrating dense breast. She will need to have a three-dimensional mammogram in 2016.     Past medical history,surgical history, family history and social history were all reviewed and documented in the EPIC chart.  Gynecologic History Patient's last menstrual period was 05/08/2009. Contraception: status post hysterectomy Last Pap: 2014. Results were: normal Last mammogram: 2015. Results were: None but it's next year she will need a 3-dimensional mammogram  Obstetric History OB History  Gravida Para Term Preterm AB SAB TAB Ectopic Multiple Living  2 2 2       2     # Outcome Date GA Lbr Len/2nd Weight Sex Delivery Anes PTL Lv  2 TRM     F CS  N Y  1 TRM     F SVD  N Y       ROS: A ROS was  performed and pertinent positives and negatives are included in the history.  GENERAL: No fevers or chills. HEENT: No change in vision, no earache, sore throat or sinus congestion. NECK: No pain or stiffness. CARDIOVASCULAR: No chest pain or pressure. No palpitations. PULMONARY: No shortness of breath, cough or wheeze. GASTROINTESTINAL: No abdominal pain, nausea, vomiting or diarrhea, melena or bright red blood per rectum. GENITOURINARY: No urinary frequency, urgency, hesitancy or dysuria. MUSCULOSKELETAL: No joint or muscle pain, no back pain, no recent trauma. DERMATOLOGIC: No rash, no itching, no lesions. ENDOCRINE: No polyuria, polydipsia, no heat or cold intolerance. No recent change in weight. HEMATOLOGICAL: No anemia or easy bruising or bleeding. NEUROLOGIC: No headache, seizures, numbness, tingling or weakness. PSYCHIATRIC: No depression, no loss of interest in normal activity or change in sleep pattern.     Exam: chaperone present  BP 134/86  Ht 5' 2.5" (1.588 m)  Wt 244 lb (110.678 kg)  BMI 43.89 kg/m2  LMP 05/08/2009  Body mass index is 43.89 kg/(m^2).  General appearance : Well developed well nourished female. No acute distress HEENT: Neck supple, trachea midline, no carotid bruits, no thyroidmegaly Lungs: Clear to auscultation, no rhonchi or wheezes, or rib retractions  Heart: Regular rate and rhythm, no murmurs or gallops Breast:Examined in sitting and supine position were symmetrical in appearance, no palpable masses or tenderness,  no skin retraction, no nipple inversion, no nipple discharge, no skin discoloration, no axillary or supraclavicular lymphadenopathy Abdomen: no palpable masses or tenderness, no rebound or  guarding Extremities: Right buttock area with 2 raised lesions  Pelvic:  Bartholin, Urethra, Skene Glands: Within normal limits             Vagina: No gross lesions or discharge  Cervix: Absent  Uterus  absent  Adnexa  Without masses or tenderness  Anus and  perineum  normal   Rectovaginal  normal sphincter tone without palpated masses or tenderness             Hemoccult cards provided   Wet prep: Yeast, many clue cells,positive amine, too numerous to count white blood cell, too numerous to count bacteria  GC and Chlamydia culture obtained results pending at time of this dictation   Assessment/Plan:  53 y.o. female for annual exam with clinical evidence of bacterial vaginosis and vaginal yeast infection. Patient will be prescribed Diflucan to take one tablet today and repeat in 48 hours. For her yeast infection. She will start Tindamax 500 mg (4 tablets) today and repeat in 24 hours for her bacterial vaginosis. Because of her history of dysplasia we will continue to do yearly Pap smears. Her PCP will be drawing  her blood work. We will do an HIV test today. She was reminded to schedule her colonoscopy she was given names gastroenterologist and her community. Next year she will need a three-dimensional mammogram as a result of the mammogram this year demonstrating that her breasts were dense. She'll be referred to the general surgeon for consultation and consideration of bariatric surgery. Patient declined flu vaccine. Fecal Hemoccult cards were provided for her to submit to the office for testing. She will need a bone density study next year. We discussed importance of calcium vitamin D and regular exercise for osteoporosis prevention. We discussed importance of monthly self breast exams.  Patient returned back to the office in 1-2 weeks to remove to Buttock raised lesions which appear to be keratotic in nature but none of them appears to be darkly pigmented the needs biopsy.  Note: This dictation was prepared with  Dragon/digital dictation along withSmart phrase technology. Any transcriptional errors that result from this process are unintentional.   Terrance Mass MD, 10:40 AM 06/21/2014

## 2014-06-22 LAB — GC/CHLAMYDIA PROBE AMP
CT Probe RNA: NEGATIVE
GC PROBE AMP APTIMA: NEGATIVE

## 2014-06-22 LAB — HIV ANTIBODY (ROUTINE TESTING W REFLEX): HIV: NONREACTIVE

## 2014-06-22 LAB — CYTOLOGY - PAP

## 2014-06-23 ENCOUNTER — Encounter (HOSPITAL_COMMUNITY): Payer: Self-pay | Admitting: Emergency Medicine

## 2014-06-23 ENCOUNTER — Emergency Department (HOSPITAL_COMMUNITY)
Admission: EM | Admit: 2014-06-23 | Discharge: 2014-06-23 | Disposition: A | Discharge status: Home / Self Care | Payer: BC Managed Care – PPO | Attending: Emergency Medicine | Admitting: Emergency Medicine

## 2014-06-23 ENCOUNTER — Emergency Department (HOSPITAL_COMMUNITY): Payer: BC Managed Care – PPO

## 2014-06-23 DIAGNOSIS — I10 Essential (primary) hypertension: Secondary | ICD-10-CM | POA: Diagnosis not present

## 2014-06-23 DIAGNOSIS — Z8673 Personal history of transient ischemic attack (TIA), and cerebral infarction without residual deficits: Secondary | ICD-10-CM | POA: Diagnosis not present

## 2014-06-23 DIAGNOSIS — Z87891 Personal history of nicotine dependence: Secondary | ICD-10-CM | POA: Diagnosis not present

## 2014-06-23 DIAGNOSIS — G43909 Migraine, unspecified, not intractable, without status migrainosus: Secondary | ICD-10-CM | POA: Diagnosis present

## 2014-06-23 DIAGNOSIS — Z91040 Latex allergy status: Secondary | ICD-10-CM | POA: Insufficient documentation

## 2014-06-23 DIAGNOSIS — Z8659 Personal history of other mental and behavioral disorders: Secondary | ICD-10-CM | POA: Diagnosis not present

## 2014-06-23 DIAGNOSIS — G44039 Episodic paroxysmal hemicrania, not intractable: Secondary | ICD-10-CM | POA: Diagnosis not present

## 2014-06-23 DIAGNOSIS — Z79899 Other long term (current) drug therapy: Secondary | ICD-10-CM | POA: Insufficient documentation

## 2014-06-23 HISTORY — DX: Migraine, unspecified, not intractable, without status migrainosus: G43.909

## 2014-06-23 MED ORDER — IOHEXOL 350 MG/ML SOLN
50.0000 mL | Freq: Once | INTRAVENOUS | Status: AC | PRN
  Administered 2014-06-23: 50 mL via INTRAVENOUS

## 2014-06-23 MED ORDER — LISINOPRIL 10 MG PO TABS: 10.0000 mg | ORAL_TABLET | Freq: Every day | ORAL | Status: DC

## 2014-06-23 MED ORDER — METOCLOPRAMIDE HCL 5 MG/ML IJ SOLN
10.0000 mg | Freq: Once | INTRAMUSCULAR | Status: AC
  Administered 2014-06-23: 10 mg via INTRAVENOUS
  Filled 2014-06-23: qty 2

## 2014-06-23 MED ORDER — DIPHENHYDRAMINE HCL 50 MG/ML IJ SOLN
25.0000 mg | Freq: Once | INTRAMUSCULAR | Status: AC
Start: 2014-06-23 — End: 2014-06-23
  Administered 2014-06-23: 25 mg via INTRAVENOUS
  Filled 2014-06-23: qty 1

## 2014-06-23 MED ORDER — DEXAMETHASONE SODIUM PHOSPHATE 10 MG/ML IJ SOLN
10.0000 mg | Freq: Once | INTRAMUSCULAR | Status: AC
  Administered 2014-06-23: 10 mg via INTRAVENOUS
  Filled 2014-06-23: qty 1

## 2014-06-23 MED ORDER — HYDROCODONE-ACETAMINOPHEN 5-325 MG PO TABS
1.0000 | ORAL_TABLET | Freq: Once | ORAL | Status: AC
  Administered 2014-06-23: 1 via ORAL
  Filled 2014-06-23: qty 1

## 2014-06-23 NOTE — ED Notes (Signed)
Patient states her right side of her face is "numb"  This happened in the past was diagnosed with a TIA.  Face is covered with a towel due to the photophobia

## 2014-06-23 NOTE — ED Provider Notes (Signed)
CSN: [id number]     Arrival date & time 06/23/14  2011 History   First MD Initiated Contact with Patient 06/23/14 2039     Chief Complaint  Patient presents with  . Migraine     (Consider location/radiation/quality/duration/timing/severity/associated sxs/prior Treatment) HPI Comments: Patient presents with a migraine. She states she's had a history of recurrent headaches which she classifies as migraines. She states over the last 2 months they've been more frequent in nature, occurring about 3 times a week. She normally takes Excedrin for the headaches and usually local weight. She states that she started having a headache yesterday. It was fairly sudden onset of a right-sided headache with some radiation down the right side of her neck. She had associated photophobia and nausea but no vomiting. She states it's the same type of pain she's had previously with her migraine-type headaches. She describes some numbness to the right side of the face which is also have other prior migraines. She denies any facial drooping or speech deficits. She denies any vision changes. She denies any balance deficits or gait disturbances. She denies any numbness or weakness to her extremities. 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 She states that she saw a neurosurgeon but has not had followup since that time.  Patient is a 53 y.o. female presenting with migraines.  Migraine Associated symptoms include headaches. Pertinent negatives include no chest pain, no abdominal pain and no shortness of breath.    Past Medical History  Diagnosis Date  . TIA (transient ischemic attack) 2000  . Panic attacks   . Hypertension   . Migraine    Past Surgical History  Procedure Laterality Date  . Cardiac catheterization  07/2007  . Abdominal hysterectomy  11/20/2008    TAH/  . Cesarean section  1982  . Cholecystectomy  1990  . Ovarian cyst removal  2007    RIGHT CYST..  BY LAPAROSCOPY  . Cervix lesion destruction    . Pelvic laparoscopy  12/17/2006    RIGHT OVARIAN TORSION  . Cervical biopsy  w/ loop electrode excision     Family History  Problem Relation Age of Onset  . Hypertension Mother   . Cancer Mother     UTERINE  . Hypertension Father   . Cancer Maternal Grandmother     LUNG  . Diabetes Maternal Grandmother   . Hypertension Maternal Grandmother   . Cancer Paternal Grandmother     UTERINE  . Hypertension Paternal Grandmother   . Hypertension Maternal Grandfather   . Hypertension Paternal Grandfather    History  Substance Use Topics  . Smoking status: Former Smoker    Quit date: 09/25/1997  . Smokeless tobacco: Never Used  . Alcohol Use: No     Comment: rare   OB History   Grav Para Term Preterm Abortions TAB SAB Ect Mult Living   2 2 2       2      Review of Systems  Constitutional: Negative for fever, chills, diaphoresis and fatigue.  HENT: Negative for congestion, rhinorrhea and sneezing.   Eyes: Positive for photophobia. Negative for visual disturbance.  Respiratory: Negative for cough, chest tightness and shortness of breath.   Cardiovascular: Negative for chest pain and leg swelling.  Gastrointestinal: Positive for nausea. Negative for vomiting, abdominal pain, diarrhea and blood in stool.  Genitourinary: Negative for frequency, hematuria, flank pain and difficulty urinating.  Musculoskeletal: Negative for arthralgias and back  pain.  Skin: Negative for rash.  Neurological: Positive for numbness (right side of face) and headaches. Negative for dizziness, speech difficulty and weakness.      Allergies  Codeine; Dilaudid; Percocet; Zofran; Latex; and Strawberry  Home Medications   Prior to Admission medications   Medication Sig Start Date End Date Taking? Authorizing Provider  acetaminophen (TYLENOL) 500 MG tablet Take 1,000 mg by mouth every 6 (six) hours as needed for moderate pain.   Yes Historical Provider, MD   aspirin-acetaminophen-caffeine (EXCEDRIN MIGRAINE) [phone] MG per tablet Take 2 tablets by mouth every 6 (six) hours as needed for headache or migraine.   Yes Historical Provider, MD  ibuprofen (ADVIL,MOTRIN) 200 MG tablet Take 400 mg by mouth every 6 (six) hours as needed for moderate pain.    Yes Historical Provider, MD  fluconazole (DIFLUCAN) 150 MG tablet Take one today and then repeat in 48 hours. 06/21/14   Terrance Mass, MD  lisinopril (PRINIVIL,ZESTRIL) 10 MG tablet Take 1 tablet (10 mg total) by mouth daily. 06/23/14   Malvin Johns, MD  tinidazole (TINDAMAX) 500 MG tablet Take four tablets today and four tablets tomorrow at the same time 06/21/14   Terrance Mass, MD   BP 176/85  Pulse 70  Temp(Src) 98.4 F (36.9 C) (Oral)  Resp 19  Ht 5\' 3"  (1.6 m)  Wt 244 lb (110.678 kg)  BMI 43.23 kg/m2  SpO2 100%  LMP 05/08/2009 Physical Exam  Constitutional: She is oriented to person, place, and time. She appears well-developed and well-nourished.  HENT:  Head: Normocephalic and atraumatic.  Eyes: Pupils are equal, round, and reactive to light.  Neck: Normal range of motion. Neck supple.  Cardiovascular: Normal rate, regular rhythm and normal heart sounds.   Pulmonary/Chest: Effort normal and breath sounds normal. No respiratory distress. She has no wheezes. She has no rales. She exhibits no tenderness.  Abdominal: Soft. Bowel sounds are normal. There is no tenderness. There is no rebound and no guarding.  Musculoskeletal: Normal range of motion. She exhibits no edema.  Lymphadenopathy:    She has no cervical adenopathy.  Neurological: She is alert and oriented to person, place, and time. She has normal strength. No cranial nerve deficit (reports some dimished sensation to right face, throughout face, including forehead) or sensory deficit. Coordination normal. GCS eye subscore is 4. GCS verbal subscore is 5. GCS motor subscore is 6.  FTN intact.  No pronator drift  Skin: Skin is  warm and dry. No rash noted.  Psychiatric: She has a normal mood and affect.    ED Course  Procedures (including critical care time) Labs Review Labs Reviewed - No data to display  Imaging Review Ct Angio Head W/cm &/or Wo Cm  06/23/2014   CLINICAL DATA:  Migraine headache for 1 day, light sensitivity, history of aneurysm.  EXAM: CT ANGIOGRAPHY HEAD  TECHNIQUE: Multidetector CT imaging of the head was performed for for and after standard protocol during bolus administration of intravenous contrast. Multiplanar CT image reconstructions and MIPs were obtained to evaluate the vascular anatomy.  CONTRAST:  87mL OMNIPAQUE IOHEXOL 350 MG/ML SOLN  COMPARISON:  None.  FINDINGS: NONCONTRAST CT HEAD:  The ventricles and sulci are normal. No intraparenchymal hemorrhage, mass effect nor midline shift. No acute large vascular territory infarcts.  No abnormal extra-axial fluid collections. Basal cisterns are patent.  No skull fracture. The included ocular globes and orbital contents are non-suspicious. The mastoid aircells and included paranasal sinuses are well-aerated.  CTA  HEAD:  Anterior circulation: Normal appearance of the cervical internal carotid arteries, petrous, cavernous and supra clinoid internal carotid arteries. The LEFT internal carotid artery is congenitally dominant, with compensatory diminutive RIGHT A1 segment. Robust LEFT A1 segment. Bony dehiscence of the anterior genu of the carotid siphons. Normal appearance of the anterior and middle cerebral arteries.  Posterior circulation: LEFT vertebral artery is dominant with normal appearance of the vertebral arteries, vertebrobasilar junction and basilar artery, as well as main branch vessels. Normal appearance of the posterior cerebral arteries. Tiny bilateral posterior communicating arteries are present. LEFT posterior communicating artery infundibulum appears stable in appearance (coronal 81/194).  No large vessel occlusion, hemodynamically  significant stenosis, dissection, luminal irregularity, contrast extravasation or aneurysm within the anterior nor posterior circulation.  Review of the MIP images confirms the above findings.  IMPRESSION: CT head:  No acute intracranial process.  CTA head: Stable LEFT posterior communicating artery infundibulum without discrete aneurysm nor acute vascular process.   Electronically Signed   By: Elon Alas   On: 06/23/2014 22:59   Ct Head Wo Contrast  06/23/2014   CLINICAL DATA:  Headache and photosensitivity.  EXAM: CT HEAD WITHOUT CONTRAST  TECHNIQUE: Contiguous axial images were obtained from the base of the skull through the vertex without intravenous contrast.  COMPARISON:  CT of the head performed 01/17/2008, and MRI of the brain performed 01/18/2008  FINDINGS: There is no evidence of acute infarction, mass lesion, or intra- or extra-axial hemorrhage on CT.  Mild subcortical white matter change likely reflects small vessel ischemic microangiopathy.  The posterior fossa, including the cerebellum, brainstem and fourth ventricle, is within normal limits. The third and lateral ventricles, and basal ganglia are unremarkable in appearance. The cerebral hemispheres are symmetric in appearance, with normal gray-white differentiation. No mass effect or midline shift is seen.  There is no evidence of fracture; visualized osseous structures are unremarkable in appearance. Mild bilateral proptosis is noted. The paranasal sinuses and mastoid air cells are well-aerated. No significant soft tissue abnormalities are seen.  IMPRESSION: 1. No acute intracranial pathology seen on CT. 2. Mild small vessel ischemic microangiopathy. 3. Mild bilateral proptosis noted.   Electronically Signed   By: Garald Balding M.D.   On: 06/23/2014 21:28     EKG Interpretation None      MDM   Final diagnoses:  Nonintractable paroxysmal hemicrania, unspecified chronicity pattern  Essential hypertension    Patient presents  with a headache consistent with her past migraine-type headaches. She has been off her blood pressure medicine for about a year. She states she stopped taking it on her own. She was previously taking lisinopril. She was given a migraine cocktail and feels much better after that. She states her headache is ominous completely resolved. Her blood pressure has improved. Her last blood pressure is 153/72. She has no current neurologic deficits. She reports that the numbness to the right side of her face has resolved. She did not ever have any other neurologic deficits. She does report that she has a history of an aneurysm that has not had followup. Given this I get a CTA which did not show any evidence of an aneurysm. There is a left P-comm infundibulum which appears unchanged from her prior imaging study. She was discharged in good condition. She has no other suggestions of subarachnoid hemorrhage, meningitis or stroke. She was encouraged to followup with her primary care physician within the next couple of weeks. I did go ahead and start her back on  her lisinopril. She was encouraged to return if her symptoms worsen.    Malvin Johns, MD 06/23/14 [phone]

## 2014-06-23 NOTE — ED Notes (Signed)
Patient presents via EMS reporting she started with a migraine yesterday.  Has been taking Excedrin Migraine without relief.  +photosensitive, + nausea.  History of HTN but has not been taking her meds for over 1 year  BP palp 190  EMS administered 41mcg Fentanyl

## 2014-06-23 NOTE — ED Notes (Signed)
Discharge instructions reviewed  Voiced understanding 

## 2014-06-28 ENCOUNTER — Ambulatory Visit (INDEPENDENT_AMBULATORY_CARE_PROVIDER_SITE_OTHER): Payer: BC Managed Care – PPO | Admitting: Gynecology

## 2014-06-28 ENCOUNTER — Encounter: Payer: Self-pay | Admitting: Gynecology

## 2014-06-28 ENCOUNTER — Telehealth: Payer: Self-pay | Admitting: *Deleted

## 2014-06-28 ENCOUNTER — Other Ambulatory Visit: Payer: Self-pay | Admitting: Gynecology

## 2014-06-28 VITALS — BP 154/96

## 2014-06-28 DIAGNOSIS — L989 Disorder of the skin and subcutaneous tissue, unspecified: Secondary | ICD-10-CM

## 2014-06-28 DIAGNOSIS — I10 Essential (primary) hypertension: Secondary | ICD-10-CM

## 2014-06-28 MED ORDER — KETOROLAC TROMETHAMINE 30 MG/ML IJ SOLN: 30.0000 mg | Freq: Once | INTRAMUSCULAR | Status: AC

## 2014-06-28 NOTE — Telephone Encounter (Signed)
Per Dr.Fernandez pt will need follow up appointment with PCP for elevated blood pressure on 150/120 with Dr.Avbuere today at 10:45am at [address] location. Pt informed.

## 2014-06-28 NOTE — Progress Notes (Signed)
   Patient was seen in the office on September 16 for her annual exam and had brought to my attention to the lesions that were bothering her noted on her right buttock. She is here to have them removed and submitted for pathological evaluation.  Exam: Right buttock area there are 2 lesions noted the superior one  is hyperpigmented and raised  and alert well and appears to be keratosis in appearance slightly raised and pale. The patient was counseled for its removal.   Procedure note: Both areas were cleansed with Betadine solution. A sterile drape was placed over the area to be excised. One percent lidocaine was infiltrated at the base of both lesions for a total of 1 cc. With the use of the scalpel H. and the individual lesions were excised and submitted separately for histological evaluation. Suture was not required silver nitrate was used for hemostasis. Neosporin was then applied and 2 Band-Aids.   Assessment/plan: Patient with 2 buttock lesions which appears to be keratosis the hyperpigmented lesion somewhat more concerning we will await pathology report. Repeat blood pressure:158/120. Patient stated that this past week and she was in the emergency room as a result of her hypertension and symptoms she was having was instructed to followup with her PCP. We'll contact his office today Dr. Nolene Ebbs for her to be seen by him today. The patient was prescribed lisinopril one year ago she has not been taking it.

## 2014-06-28 NOTE — Addendum Note (Signed)
Addended by: Thurnell Garbe A on: 06/28/2014 09:29 AM   Modules accepted: Orders

## 2014-07-04 ENCOUNTER — Telehealth: Payer: Self-pay

## 2014-07-04 NOTE — Telephone Encounter (Signed)
Patient called regarding bx results from last Weds. I called pathology and she said they were released days ago however, not available in our Epic system to be viewed by physician.  She faxed report and later called to say that they showed it released to Epic and that problem is on our end and we will have to call someone to fix it.  I left message for [name] as to whom I should contact.  I placed faxed report in Dr [name] box.  At 4:30pm Dr. Moshe [name] had not reviewed it yet but had left for the day.  I called patient and left a message that he had not reviewed it but biopsy is benign.  I told her that Dr. Moshe [name] will review tomorrow and I will let her know any recommendations he makes at that time but wanted to reassure her tonight it was benign.

## 2014-07-05 NOTE — Telephone Encounter (Signed)
Patient informed Path with seborrheic keratosis per Dr. Moshe Salisbury.  Patient is concerned whether or not sites are infected.  She said both sites had a large red ring around them initially. The red rings still persist but may have improved a bit.  She said that both incisions are draining.  She said that the bottom incision itches but the top one is still painful.

## 2014-07-05 NOTE — Telephone Encounter (Signed)
Patient advised. Appt scheduled for later this week.

## 2014-07-05 NOTE — Telephone Encounter (Signed)
Her to use antibacterial soap and clean the area twice a day and apply  Neosporin to the area 2-3 times a day and I will see her later this week

## 2014-07-07 ENCOUNTER — Ambulatory Visit: Payer: BC Managed Care – PPO | Admitting: Gynecology

## 2014-07-25 ENCOUNTER — Encounter: Payer: Self-pay | Admitting: Gynecology

## 2014-07-25 ENCOUNTER — Ambulatory Visit (INDEPENDENT_AMBULATORY_CARE_PROVIDER_SITE_OTHER): Payer: BC Managed Care – PPO | Admitting: Gynecology

## 2014-07-25 VITALS — BP 142/88

## 2014-07-25 DIAGNOSIS — L821 Other seborrheic keratosis: Secondary | ICD-10-CM

## 2014-07-25 MED ORDER — NYSTATIN-TRIAMCINOLONE 100000-0.1 UNIT/GM-% EX CREA: 1.0000 "application " | TOPICAL_CREAM | Freq: Three times a day (TID) | CUTANEOUS | Status: DC

## 2014-07-25 NOTE — Progress Notes (Signed)
   Patient presented to the office today for 4 weeks post excision of right buttock lesions. Patient had previously been informed on the following report:  Diagnosis 1. Skin , upper right buttock, bx SEBORRHEIC KERATOSIS, IRRITATED, SEE DESCRIPTION 2. Skin , lower right buttock, bx SEBORRHEIC KERATOSIS, IRRITATED, SEE DESCRIPTION Microscopic Description 1. There is hyperplasia of the epidermis with hyperkeratosis. There is an inflammatory infiltrate present containing primarily mononuclear cells, primarily lymphocytes. Epidermal cells appear somewhat enlarged due to increased cytoplasmic volume. Appreciable squamous atypia is not seen. These changes are those of irritated seborrheic keratosis. Although there are not definitive viral features, due to the location, a condyloma cannot be ruled out. 2. There is hyperplasia of the epidermis with hyperkeratosis. There is an inflammatory infiltrate present containing primarily mononuclear cells, primarily lymphocytes. Epidermal cells appear somewhat enlarged due to increased cytoplasmic volume. Appreciable squamous atypia is not seen. These changes are those of irritated seborrheic keratosis. Although there are not definitive viral features, due to the location, a condyloma cannot be ruled out.  Patient is doing well otherwise only some pruritus  Exam: Both biopsy sites healing nicely small scabs were noted  Assessment/plan:SEBORRHEIC KERATOSIS, vision we'll prescribe mytrex cream to apply twice a day for 7 days. Then she can use Maderma cream for scaring PRN.

## 2014-08-07 ENCOUNTER — Encounter: Payer: Self-pay | Admitting: Gynecology

## 2015-07-26 ENCOUNTER — Ambulatory Visit: Payer: BC Managed Care – PPO | Admitting: Gynecology

## 2015-08-11 ENCOUNTER — Other Ambulatory Visit: Payer: Self-pay

## 2015-08-11 ENCOUNTER — Other Ambulatory Visit: Payer: Self-pay | Admitting: Internal Medicine

## 2015-08-11 DIAGNOSIS — E2839 Other primary ovarian failure: Secondary | ICD-10-CM

## 2015-08-11 DIAGNOSIS — Z1231 Encounter for screening mammogram for malignant neoplasm of breast: Secondary | ICD-10-CM

## 2015-09-19 ENCOUNTER — Inpatient Hospital Stay: Admission: RE | Admit: 2015-09-19 | Payer: BC Managed Care – PPO | Source: Ambulatory Visit

## 2015-09-19 ENCOUNTER — Ambulatory Visit: Payer: BC Managed Care – PPO

## 2016-01-25 ENCOUNTER — Ambulatory Visit: Payer: BC Managed Care – PPO | Admitting: Gynecology

## 2016-07-31 ENCOUNTER — Other Ambulatory Visit: Payer: Self-pay | Admitting: Gynecology

## 2016-07-31 DIAGNOSIS — N6320 Unspecified lump in the left breast, unspecified quadrant: Secondary | ICD-10-CM

## 2016-08-01 ENCOUNTER — Ambulatory Visit (INDEPENDENT_AMBULATORY_CARE_PROVIDER_SITE_OTHER): Payer: BC Managed Care – PPO | Admitting: Gynecology

## 2016-08-01 ENCOUNTER — Encounter: Payer: Self-pay | Admitting: Gynecology

## 2016-08-01 ENCOUNTER — Telehealth: Payer: Self-pay | Admitting: *Deleted

## 2016-08-01 VITALS — BP 136/88

## 2016-08-01 DIAGNOSIS — N6320 Unspecified lump in the left breast, unspecified quadrant: Secondary | ICD-10-CM

## 2016-08-01 DIAGNOSIS — N63 Unspecified lump in unspecified breast: Secondary | ICD-10-CM

## 2016-08-01 NOTE — Telephone Encounter (Signed)
Appointment on 08/06/16 @ 2:40pm at [facility], pt aware.

## 2016-08-01 NOTE — Progress Notes (Signed)
   Patient is a 55 year old that presented to the office today stating that for the past 6 months she notices tender nodular area on her left breast. She denied any recent trauma or nipple discharge. She denied any fever, chills, nausea, vomiting or any change in weight or appetite. Her last mammogram was in 2015 which was described as dense. She has a half-sister with breast cancer diagnosed at the age of 68 as well as a maternal aunt. They have not had any genetic testing. Patient with past history abdominal hysterectomy with right salpingo-oophorectomy.  Exam: Both breasts were examined sitting supine position both breasts were pendulous symmetrical in appearance no skin discoloration or nipple inversion no supra clavicular axillary lymphadenopathy. Right breast no palpable masses or tenderness. Left breast 11:00 position periareolar region there was a 1/2 cm nodule tender spot noted.  Physical Exam  Pulmonary/Chest:     Assessment/plan: Patient with left breast mass been present for 6 month and not significant medical attention until now. Patient will be scheduled for a diagnostic mammogram and possible ultrasound of that left breast and screening mammogram of the right breast.

## 2016-08-01 NOTE — Telephone Encounter (Signed)
-----   Message from Terrance Mass, MD sent at 08/01/2016 10:32 AM EDT ----- [name], please schedule diagnostic mammogram for this patient with a tender mass on left breast at 11:00 position periareolar area. She also needs a screening mammogram on the contralateral breast.

## 2016-08-01 NOTE — Patient Instructions (Signed)
Breast Cyst A breast cyst is a sac in the breast that is filled with fluid. Breast cysts are common in women. Women can have one or many cysts. When the breasts contain many cysts, it is usually due to a noncancerous (benign) condition called fibrocystic change. These lumps form under the influence of female hormones (estrogen and progesterone). The lumps are most often located in the upper, outer portion of the breast. They are often more swollen, painful, and tender before your period starts. They usually disappear after menopause, unless you are on hormone therapy.  There are several types of cysts:  Macrocyst. This is a cyst that is about 2 in. (5.1 cm) in diameter.   Microcyst. This is a tiny cyst that you cannot feel but can be seen with a mammogram or an ultrasound.   Galactocele. This is a cyst containing milk that may develop if you suddenly stop breastfeeding.   Sebaceous cyst of the skin. This type of cyst is not in the breast tissue itself. Breast cysts do not increase your risk of breast cancer. However, they must be monitored closely because they can be cancerous.  CAUSES  It is not known exactly what causes a breast cyst to form. Possible causes include:  An overgrowth of milk glands and connective tissue in the breast can block the milk glands, causing them to fill with fluid.   Scar tissue in the breast from previous surgery may block the glands, causing a cyst.  RISK FACTORS Estrogen may influence the development of a breast cyst.  SIGNS AND SYMPTOMS   Feeling a smooth, round, soft lump (like a grape) in the breast that is easily moveable.   Breast discomfort or pain.  Increase in size of the lump before your menstrual period and decrease in its size after your menstrual period.  DIAGNOSIS  A cyst can be felt during a physical exam by your health care provider. A breast X-ray exam (mammogram) and ultrasonography will be done to confirm the diagnosis. Fluid may  be removed from the cyst with a needle (fine needle aspiration) to make sure the cyst is not cancerous.  TREATMENT  Treatment may not be necessary. Your health care provider may monitor the cyst to see if it goes away on its own. If treatment is needed, it may include:  Hormone treatment.   Needle aspiration. There is a chance of the cyst coming back after aspiration.   Surgery to remove the whole cyst.  HOME CARE INSTRUCTIONS   Keep all follow-up appointments with your health care provider.  See your health care provider regularly:  Get a yearly exam by your health care provider.  Have a clinical breast exam by a health care provider every 1-3 years if you are 20-40 years of age. After age 40 years, you should have the exam every year.   Get mammogram tests as directed by your health care provider.   Understand the normal appearance and feel of your breasts and perform breast self-exams.   Only take over-the-counter or prescription medicines as directed by your health care provider.   Wear a supportive bra, especially when exercising.   Avoid caffeine.   Reduce your salt intake, especially before your menstrual period. Too much salt can cause fluid retention, breast swelling, and discomfort.  SEEK MEDICAL CARE IF:   You feel, or think you feel, a lump in your breast.   You notice that both breasts look or feel different than usual.   Your   breast is still causing pain after your menstrual period is over.   You need medicine for breast pain and swelling that occurs with your menstrual period.  SEEK IMMEDIATE MEDICAL CARE IF:   You have severe pain, tenderness, redness, or warmth in your breast.   You have nipple discharge or bleeding.   Your breast lump becomes hard and painful.   You find new lumps or bumps that were not there before.   You feel lumps in your armpit (axilla).   You notice dimpling or wrinkling of the breast or nipple.   You  have a fever.  MAKE SURE YOU:  Understand these instructions.  Will watch your condition.  Will get help right away if you are not doing well or get worse.   This information is not intended to replace advice given to you by your health care provider. Make sure you discuss any questions you have with your health care provider.   Document Released: 09/22/2005 Document Revised: 05/25/2013 Document Reviewed: 04/21/2013 Elsevier Interactive Patient Education [employer].

## 2016-08-06 ENCOUNTER — Ambulatory Visit
Admission: RE | Admit: 2016-08-06 | Discharge: 2016-08-06 | Disposition: A | Discharge status: Home / Self Care | Payer: BC Managed Care – PPO | Source: Ambulatory Visit | Attending: Gynecology | Admitting: Gynecology

## 2016-08-06 DIAGNOSIS — N63 Unspecified lump in unspecified breast: Secondary | ICD-10-CM

## 2016-08-20 ENCOUNTER — Encounter: Payer: Self-pay | Admitting: Gynecology

## 2016-08-20 ENCOUNTER — Ambulatory Visit (INDEPENDENT_AMBULATORY_CARE_PROVIDER_SITE_OTHER): Payer: BC Managed Care – PPO | Admitting: Gynecology

## 2016-08-20 VITALS — BP 132/84 | Ht 62.5 in | Wt 246.0 lb

## 2016-08-20 DIAGNOSIS — Z780 Asymptomatic menopausal state: Secondary | ICD-10-CM | POA: Diagnosis not present

## 2016-08-20 DIAGNOSIS — L292 Pruritus vulvae: Secondary | ICD-10-CM | POA: Diagnosis not present

## 2016-08-20 DIAGNOSIS — Z01411 Encounter for gynecological examination (general) (routine) with abnormal findings: Secondary | ICD-10-CM | POA: Diagnosis not present

## 2016-08-20 LAB — WET PREP FOR TRICH, YEAST, CLUE
Clue Cells Wet Prep HPF POC: NONE SEEN
TRICH WET PREP: NONE SEEN
WBC WET PREP: NONE SEEN
YEAST WET PREP: NONE SEEN

## 2016-08-20 MED ORDER — CLINDAMYCIN PHOSPHATE 2 % VA CREA: TOPICAL_CREAM | VAGINAL | 2 refills | Status: DC

## 2016-08-20 NOTE — Patient Instructions (Signed)
Clindamycin vaginal cream What is this medicine? CLINDAMYCIN (Luxora sin) is a lincosamide antibiotic. It is used to treat vaginal infections caused by certain bacteria. This medicine may be used for other purposes; ask your health care provider or pharmacist if you have questions. COMMON BRAND NAME(S): Cleocin, ClindaMax, Clindesse What should I tell my health care provider before I take this medicine? They need to know if you have any of these conditions: -diarrhea -inflammatory bowel disease -kidney or liver disease -stomach problems like colitis -an unusual or allergic reaction to clindamycin, lincomycin, other medicines, foods, dyes or preservatives -pregnant or trying to get pregnant -breast-feeding How should I use this medicine? This medicine is only for use in the vagina. Place in the vagina using the special applicator supplied with the cream. Wash hands before and after use. Fill the applicator with cream. Lie on your back, part and bend your knees. Insert the applicator into the vagina and push the plunger to expel the cream into the vagina. Wash the applicator with warm soapy water and rinse well. Keep this medicine out of the eyes. If you do get any in your eyes rinse out with plenty of cool tap water. Take your medicine at regular intervals. Do not take your medicine more often than directed. Use this medicine for the full course prescribed by your doctor or health care professional, even if you think your condition is better. Do not stop using except on your the advice of your doctor or health care professional. Talk to your pediatrician regarding the use of this medicine in children. Special care may be needed. Overdosage: If you think you have taken too much of this medicine contact a poison control center or emergency room at once. NOTE: This medicine is only for you. Do not share this medicine with others. What if I miss a dose? If you miss a dose, use it as soon as you  can. If it is almost time for your next dose, use only that dose. Do not use double or extra doses. What may interact with this medicine? Interactions are not expected. Do not use any other vaginal products without telling your doctor or health care professional. This list may not describe all possible interactions. Give your health care provider a list of all the medicines, herbs, non-prescription drugs, or dietary supplements you use. Also tell them if you smoke, drink alcohol, or use illegal drugs. Some items may interact with your medicine. What should I watch for while using this medicine? Tell your doctor or health care professional if your symptoms do not start to get better in a few days. Do not use tampons or douches while using this medicine. Do not have sex until you have finished your treatment. Having sex can make the treatment less effective. After you finish treatment, do not use latex condoms for three days. There may still be some medicine in the vagina. This can damage the latex and make the condom less effective at preventing pregnancy. Your clothing may get soiled. To help prevent reinfection, wear freshly washed cotton, not synthetic, underwear. What side effects may I notice from receiving this medicine? Side effects that you should report to your doctor or health care professional as soon as possible: -allergic reactions like skin rash, itching or hives, swelling of the face, lips, or tongue -diarrhea that is watery or severe -fever or chills, sore throat -increased thirst -itching of the vaginal or genital area -pain during sexual intercourse -stomach pain or  cramps -thick white vaginal discharge -unusual bleeding or bruising Side effects that usually do not require medical attention (report to your doctor or health care professional if they continue or are bothersome): -nausea, vomiting This list may not describe all possible side effects. Call your doctor for medical  advice about side effects. You may report side effects to FDA at 1-800-FDA-1088. Where should I keep my medicine? Keep out of the reach of children. Store at room temperature between 20 and 25 degrees C (68 and 77 degrees F). Do not freeze. Throw away any unused medicine after the expiration date. NOTE: This sheet is a summary. It may not cover all possible information. If you have questions about this medicine, talk to your doctor, pharmacist, or health care provider.  2017 Elsevier/Gold Standard (2013-04-28 16:18:30)

## 2016-08-20 NOTE — Addendum Note (Signed)
Addended by: Thurnell Garbe A on: 08/20/2016 12:00 PM   Modules accepted: Orders

## 2016-08-20 NOTE — Progress Notes (Signed)
[name] 1961/05/28 [id number]   History:    55 y.o.  for annual gyn exam with complaint of several months of external genital irritation and pruritus. She has not been sexually active and close to a year. Patient denies any vaginal discharge, any dysuria or frequency.Patient with past history of abdominal hysterectomy and right salpingo-oophorectomy. She was complaining of some weight gain.Review of her record indicated that she is seeing Dr. Donnella Bi in 2008 and had a cardiac catheterization. Patient also has informed me that she has had history of cervical conization for high-grade dysplasia several years ago before she moved to the area. Patient followup afterwards were normal. And pathology report from her total abdominal hysterectomy demonstrated no dysplasia on her cervix. Her primary physician has been doing her lab work and is treating her for hypertension. Patient declined a flu vaccine today. Patient believes she has had the Tdap vaccine in the past. Patient menopausal on no hormone replacement therapy. Patient with no vasomotor symptoms. Patient has not had her colonoscopy as of yet.  Past medical history,surgical history, family history and social history were all reviewed and documented in the EPIC chart.  Gynecologic History Patient's last menstrual period was 05/08/2009. Contraception: status post hysterectomy Last Pap: 2015. Results were: normal Last mammogram: 2017. Results were: normal  Obstetric History OB History  Gravida Para Term Preterm AB Living  2 2 2     2   SAB TAB Ectopic Multiple Live Births          2    # Outcome Date GA Lbr Len/2nd Weight Sex Delivery Anes PTL Lv  2 Term     F CS-Unspec  N LIV  1 Term     F Vag-Spont  N LIV       ROS: A ROS was performed and pertinent positives and negatives are included in the history.  GENERAL: No fevers or chills. HEENT: No change in vision, no earache, sore throat or sinus congestion. NECK: No  pain or stiffness. CARDIOVASCULAR: No chest pain or pressure. No palpitations. PULMONARY: No shortness of breath, cough or wheeze. GASTROINTESTINAL: No abdominal pain, nausea, vomiting or diarrhea, melena or bright red blood per rectum. GENITOURINARY: No urinary frequency, urgency, hesitancy or dysuria. MUSCULOSKELETAL: No joint or muscle pain, no back pain, no recent trauma. DERMATOLOGIC: No rash, no itching, no lesions. ENDOCRINE: No polyuria, polydipsia, no heat or cold intolerance. No recent change in weight. HEMATOLOGICAL: No anemia or easy bruising or bleeding. NEUROLOGIC: No headache, seizures, numbness, tingling or weakness. PSYCHIATRIC: No depression, no loss of interest in normal activity or change in sleep pattern.     Exam: chaperone present  BP 132/84   Ht 5' 2.5" (1.588 m)   Wt 246 lb (111.6 kg)   LMP 05/08/2009   BMI 44.28 kg/m   Body mass index is 44.28 kg/m.  General appearance : Well developed well nourished female. No acute distress HEENT: Eyes: no retinal hemorrhage or exudates,  Neck supple, trachea midline, no carotid bruits, no thyroidmegaly Lungs: Clear to auscultation, no rhonchi or wheezes, or rib retractions  Heart: Regular rate and rhythm, no murmurs or gallops Breast:Examined in sitting and supine position were symmetrical in appearance, no palpable masses or tenderness,  no skin retraction, no nipple inversion, no nipple discharge, no skin discoloration, no axillary or supraclavicular lymphadenopathy Abdomen: no palpable masses or tenderness, no rebound or guarding Extremities: no edema or skin discoloration or tenderness  Pelvic:  Bartholin, Urethra, Skene Glands:  Within normal limits             Vagina: No gross lesions or discharge  Cervix: Absent  Uterus  absent  Adnexa  Without masses or tenderness  Anus and perineum  normal   Rectovaginal  normal sphincter tone without palpated masses or tenderness             Hemoccult to schedule colonoscopy    Wet prep few bacteria otherwise normal  Assessment/Plan:  55 y.o. female for annual exam who is menopausal not sexually active as no vaginal issues with the exception of pruritus and on examination her area of the fourchette was very erythematous and appears to have some paper cut like areas from scratching. 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 For this reason we'll also check her vitamin D level because of medical literature states that there is questionable whether this could be attributed to vitamin D deficiency. If her symptoms continue after the above treatment we discussed consideration of vaginal estrogen 2-3 times a week. Her PCP has been doing her blood work. She declined the flu vaccine today. She was provided with the name of gastroenterologist for her to schedule her colonoscopy. We discussed importance of calcium vitamin D and weightbearing exercise for osteoporosis prevention. Patient is to schedule her bone density study next year.   Terrance Mass MD, 11:05 AM 08/20/2016

## 2016-08-21 ENCOUNTER — Encounter: Payer: Self-pay | Admitting: Gynecology

## 2016-08-21 ENCOUNTER — Other Ambulatory Visit: Payer: Self-pay | Admitting: Gynecology

## 2016-08-21 DIAGNOSIS — E559 Vitamin D deficiency, unspecified: Secondary | ICD-10-CM

## 2016-08-21 LAB — VITAMIN D 25 HYDROXY (VIT D DEFICIENCY, FRACTURES): Vit D, 25-Hydroxy: 13 ng/mL — ABNORMAL LOW (ref 30–100)

## 2016-08-21 MED ORDER — VITAMIN D (ERGOCALCIFEROL) 1.25 MG (50000 UNIT) PO CAPS: 50000.0000 [IU] | ORAL_CAPSULE | ORAL | 0 refills | Status: DC

## 2016-08-25 LAB — PAP IG W/ RFLX HPV ASCU

## 2016-09-02 ENCOUNTER — Other Ambulatory Visit: Payer: Self-pay | Admitting: Sports Medicine

## 2016-09-02 DIAGNOSIS — M25562 Pain in left knee: Secondary | ICD-10-CM

## 2016-11-04 ENCOUNTER — Encounter: Payer: Self-pay | Admitting: Gynecology

## 2016-11-04 ENCOUNTER — Telehealth: Payer: Self-pay | Admitting: *Deleted

## 2016-11-04 ENCOUNTER — Ambulatory Visit (INDEPENDENT_AMBULATORY_CARE_PROVIDER_SITE_OTHER): Payer: BC Managed Care – PPO | Admitting: Gynecology

## 2016-11-04 VITALS — BP 128/80 | Ht 62.0 in | Wt 246.0 lb

## 2016-11-04 DIAGNOSIS — K640 First degree hemorrhoids: Secondary | ICD-10-CM | POA: Diagnosis not present

## 2016-11-04 DIAGNOSIS — K921 Melena: Secondary | ICD-10-CM

## 2016-11-04 DIAGNOSIS — K5900 Constipation, unspecified: Secondary | ICD-10-CM

## 2016-11-04 DIAGNOSIS — K5909 Other constipation: Secondary | ICD-10-CM

## 2016-11-04 DIAGNOSIS — Z1211 Encounter for screening for malignant neoplasm of colon: Secondary | ICD-10-CM

## 2016-11-04 MED ORDER — LINACLOTIDE 145 MCG PO CAPS: 145.0000 ug | ORAL_CAPSULE | Freq: Every day | ORAL | 11 refills | Status: DC

## 2016-11-04 NOTE — Progress Notes (Signed)
Patient ID: [name], female   DOB: May 29, 1961, 56 y.o.   MRN: [id number]    Patient is a 56 year old that presented to the office today stating that she is suffering from worsening constipation. She does have a bowel movement every day. She states that when she has a bowel movement she notices a lot of blood in her urine. She's tried over-the-counter Anusol suppository but has had no relief. Patient has not had her colonoscopy yet. She has no change in appetite.  Exam: She was examined in the supine position and then in the knee-chest position and with the use of any the scope there was no evidence of any internal hemorrhoid only a very fleshy tissue on the external rectal area outside the anal verge from a previous hemorrhoid. It is not indurated is soft eye cannot determine where she had been bleeding from.  Assessment/plan: Patient with chronic constipation overdue for screening colonoscopy since she has never had one and she's now 56 years of age. I am going to prescribe her Linzess 145 g to take before breakfast every morning and to increase her fluid intake and high fiber diet to soft in her stools. We are going to make arrangements for her to see the gastroenterologist for further evaluation such as with a colonoscopy. My fear is that the bleeding may be coming from upper in the GI tract and not necessarily firm her rectal area since I could not identify source.

## 2016-11-04 NOTE — Telephone Encounter (Signed)
Referral placed at [facility] they will contact pt to schedule. 

## 2016-11-04 NOTE — Telephone Encounter (Signed)
-----   Message from Terrance Mass, MD sent at 11/04/2016  2:35 PM EST ----- [name], please make an appointment for this patient at [facility]. Patient with hematochezia chronic constipation and needs her colonoscopy.

## 2016-11-04 NOTE — Patient Instructions (Signed)
Hemorrhoids Hemorrhoids are swollen veins in and around the rectum or anus. There are two types of hemorrhoids:  Internal hemorrhoids. These occur in the veins that are just inside the rectum. They may poke through to the outside and become irritated and painful.  External hemorrhoids. These occur in the veins that are outside of the anus and can be felt as a painful swelling or hard lump near the anus. Most hemorrhoids do not cause serious problems, and they can be managed with home treatments such as diet and lifestyle changes. If home treatments do not help your symptoms, procedures can be done to shrink or remove the hemorrhoids. What are the causes? This condition is caused by increased pressure in the anal area. This pressure may result from various things, including:  Constipation.  Straining to have a bowel movement.  Diarrhea.  Pregnancy.  Obesity.  Sitting for long periods of time.  Heavy lifting or other activity that causes you to strain.  Anal sex. What are the signs or symptoms? Symptoms of this condition include:  Pain.  Anal itching or irritation.  Rectal bleeding.  Leakage of stool (feces).  Anal swelling.  One or more lumps around the anus. How is this diagnosed? This condition can often be diagnosed through a visual exam. Other exams or tests may also be done, such as:  Examination of the rectal area with a gloved hand (digital rectal exam).  Examination of the anal canal using a small tube (anoscope).  A blood test, if you have lost a significant amount of blood.  A test to look inside the colon (sigmoidoscopy or colonoscopy). How is this treated? This condition can usually be treated at home. However, various procedures may be done if dietary changes, lifestyle changes, and other home treatments do not help your symptoms. These procedures can help make the hemorrhoids smaller or remove them completely. Some of these procedures involve surgery,  and others do not. Common procedures include:  Rubber band ligation. Rubber bands are placed at the base of the hemorrhoids to cut off the blood supply to them.  Sclerotherapy. Medicine is injected into the hemorrhoids to shrink them.  Infrared coagulation. A type of light energy is used to get rid of the hemorrhoids.  Hemorrhoidectomy surgery. The hemorrhoids are surgically removed, and the veins that supply them are tied off.  Stapled hemorrhoidopexy surgery. A circular stapling device is used to remove the hemorrhoids and use staples to cut off the blood supply to them. Follow these instructions at home: Eating and drinking  Eat foods that have a lot of fiber in them, such as whole grains, beans, nuts, fruits, and vegetables. Ask your health care provider about taking products that have added fiber (fiber supplements).  Drink enough fluid to keep your urine clear or pale yellow. Managing pain and swelling  Take warm sitz baths for 20 minutes, 3-4 times a day to ease pain and discomfort.  If directed, apply ice to the affected area. Using ice packs between sitz baths may be helpful.  Put ice in a plastic bag.  Place a towel between your skin and the bag.  Leave the ice on for 20 minutes, 2-3 times a day. General instructions  Take over-the-counter and prescription medicines only as told by your health care provider.  Use medicated creams or suppositories as told.  Exercise regularly.  Go to the bathroom when you have the urge to have a bowel movement. Do not wait.  Avoid straining to have  bowel movements.  Keep the anal area dry and clean. Use wet toilet paper or moist towelettes after a bowel movement.  Do not sit on the toilet for long periods of time. This increases blood pooling and pain. Contact a health care provider if:  You have increasing pain and swelling that are not controlled by treatment or medicine.  You have uncontrolled bleeding.  You have  difficulty having a bowel movement, or you are unable to have a bowel movement.  You have pain or inflammation outside the area of the hemorrhoids. This information is not intended to replace advice given to you by your health care provider. Make sure you discuss any questions you have with your health care provider. Document Released: 09/19/2000 Document Revised: 02/20/2016 Document Reviewed: 06/06/2015 Elsevier Interactive Patient Education  2017 [employer]. Colonoscopy, Adult A colonoscopy is an exam to look at the entire large intestine. During the exam, a lubricated, bendable tube is inserted into the anus and then passed into the rectum, colon, and other parts of the large intestine. A colonoscopy is often done as a part of normal colorectal screening or in response to certain symptoms, such as anemia, persistent diarrhea, abdominal pain, and blood in the stool. The exam can help screen for and diagnose medical problems, including:  Tumors.  Polyps.  Inflammation.  Areas of bleeding. Tell a health care provider about:  Any allergies you have.  All medicines you are taking, including vitamins, herbs, eye drops, creams, and over-the-counter medicines.  Any problems you or family members have had with anesthetic medicines.  Any blood disorders you have.  Any surgeries you have had.  Any medical conditions you have.  Any problems you have had passing stool. What are the risks? Generally, this is a safe procedure. However, problems may occur, including:  Bleeding.  A tear in the intestine.  A reaction to medicines given during the exam.  Infection (rare). What happens before the procedure? Eating and drinking restrictions  Follow instructions from your health care provider about eating and drinking, which may include:  A few days before the procedure - follow a low-fiber diet. Avoid nuts, seeds, dried fruit, raw fruits, and vegetables.  1-3 days before the  procedure - follow a clear liquid diet. Drink only clear liquids, such as clear broth or bouillon, black coffee or tea, clear juice, clear soft drinks or sports drinks, gelatin desert, and popsicles. Avoid any liquids that contain red or purple dye.  On the day of the procedure - do not eat or drink anything during the 2 hours before the procedure, or within the time period that your health care provider recommends. Bowel prep  If you were prescribed an oral bowel prep to clean out your colon:  Take it as told by your health care provider. Starting the day before your procedure, you will need to drink a large amount of medicated liquid. The liquid will cause you to have multiple loose stools until your stool is almost clear or light green.  If your skin or anus gets irritated from diarrhea, you may use these to relieve the irritation:  Medicated wipes, such as adult wet wipes with aloe and vitamin E.  A skin soothing-product like petroleum jelly.  If you vomit while drinking the bowel prep, take a break for up to 60 minutes and then begin the bowel prep again. If vomiting continues and you cannot take the bowel prep without vomiting, call your health care provider. General instructions  Ask your health care provider about changing or stopping your regular medicines. This is especially important if you are taking diabetes medicines or blood thinners.  Plan to have someone take you home from the hospital or clinic. What happens during the procedure?  An IV tube may be inserted into one of your veins.  You will be given medicine to help you relax (sedative).  To reduce your risk of infection:  Your health care team will wash or sanitize their hands.  Your anal area will be washed with soap.  You will be asked to lie on your side with your knees bent.  Your health care provider will lubricate a long, thin, flexible tube. The tube will have a camera and a light on the end.  The tube  will be inserted into your anus.  The tube will be gently eased through your rectum and colon.  Air will be delivered into your colon to keep it open. You may feel some pressure or cramping.  The camera will be used to take images during the procedure.  A small tissue sample may be removed from your body to be examined under a microscope (biopsy). If any potential problems are found, the tissue will be sent to a lab for testing.  If small polyps are found, your health care provider may remove them and have them checked for cancer cells.  The tube that was inserted into your anus will be slowly removed. The procedure may vary among health care providers and hospitals. What happens after the procedure?  Your blood pressure, heart rate, breathing rate, and blood oxygen level will be monitored until the medicines you were given have worn off.  Do not drive for 24 hours after the exam.  You may have a small amount of blood in your stool.  You may pass gas and have mild abdominal cramping or bloating due to the air that was used to inflate your colon during the exam.  It is up to you to get the results of your procedure. Ask your health care provider, or the department performing the procedure, when your results will be ready. This information is not intended to replace advice given to you by your health care provider. Make sure you discuss any questions you have with your health care provider. Document Released: 09/19/2000 Document Revised: 04/11/2016 Document Reviewed: 12/04/2015 Elsevier Interactive Patient Education  2017 [employer].

## 2016-11-05 ENCOUNTER — Encounter: Payer: Self-pay | Admitting: Physician Assistant

## 2016-11-05 NOTE — Telephone Encounter (Signed)
Appointment on 11/12/16 with Dr.Esterwood

## 2016-11-10 ENCOUNTER — Telehealth: Payer: Self-pay | Admitting: *Deleted

## 2016-11-10 NOTE — Telephone Encounter (Signed)
Pt called c/o sharp abdomen pain this am, has appointment with GI MD on 11/12/16, states pain was very intense, had a bowel movement and pain has stopped. Pt states she called [facility] and no cancellation today. Pt was advised to go to ER if pain should occur again. Encouraged to drink plenty of fluids, notes blood with bowel movement as well. Will have liquid diet for now, states makes her stomach upset to think about solid food. Once again pt will follow up at [facility] on 11/12/16 or ER if pain should return.

## 2016-11-12 ENCOUNTER — Encounter: Payer: Self-pay | Admitting: Gastroenterology

## 2016-11-12 ENCOUNTER — Encounter: Payer: Self-pay | Admitting: Physician Assistant

## 2016-11-12 ENCOUNTER — Ambulatory Visit (INDEPENDENT_AMBULATORY_CARE_PROVIDER_SITE_OTHER): Payer: BC Managed Care – PPO | Admitting: Physician Assistant

## 2016-11-12 VITALS — BP 138/78 | HR 76 | Ht 63.0 in | Wt 249.0 lb

## 2016-11-12 DIAGNOSIS — K625 Hemorrhage of anus and rectum: Secondary | ICD-10-CM | POA: Diagnosis not present

## 2016-11-12 NOTE — Progress Notes (Signed)
Subjective:    Patient ID: [name], female    DOB: 21-Jan-1961, 56 y.o.   MRN: [id number]  HPI [name] is a 56 year old African-American female, new to GI today referred by Dr. Toney Rakes for evaluation of rectal bleeding. She has not had any prior GI evaluation. History is pertinent for hypertension and morbid obesity. She says she started having bright red blood per rectum about 3-4 weeks ago. She said she thought it may have been secondary to hemorrhoids and started using a suppository. She said she had a few days without any blood and then the bleeding recurred and has been persistent and noted on a daily basis since. Does not had any melena. She says her stools have been fairly normal though she was constipated at the onset of these symptoms and had some straining. She is seeing bright red blood, occasionally clots with blood noted on the tissue and in the commode and perhaps mixed with the stool. She denies any rectal pain though did have some discomfort at the very onset of symptoms when she was constipated. She denies any abdominal pain or cramping. Her appetite has been good, weight has been stable. 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 She says Marcello Moores worked great for her heartburn she does not want to take any other medication if she can help but, no complaints of dysphagia or odynophagia. Family history is pertinent for maternal grandmother with some sort of intestinal cancer, mother and sister with ovarian cancer. She says she has not had a colonoscopy to this point because she has heard "horror stories." She was started on Linzess about a week ago ,and is having regular bowel movements at this point.  Review of Systems Pertinent positive and negative review of systems were noted in the above HPI section.  All other review of systems was otherwise  negative.  Outpatient Encounter Prescriptions as of 11/12/2016  Medication Sig  . acetaminophen (TYLENOL) 500 MG tablet Take 1,000 mg by mouth every 6 (six) hours as needed for moderate pain.  Marland Kitchen aspirin-acetaminophen-caffeine (EXCEDRIN MIGRAINE) 250-250-65 MG per tablet Take 2 tablets by mouth every 6 (six) hours as needed for headache or migraine.  . calcium carbonate (TUMS - DOSED IN MG ELEMENTAL CALCIUM) 500 MG chewable tablet Chew 1 tablet by mouth as needed for indigestion or heartburn.  Marland Kitchen ibuprofen (ADVIL,MOTRIN) 200 MG tablet Take 400 mg by mouth every 6 (six) hours as needed for moderate pain.   Marland Kitchen linaclotide (LINZESS) 145 MCG CAPS capsule Take 1 capsule (145 mcg total) by mouth daily before breakfast.  . lisinopril-hydrochlorothiazide (PRINZIDE,ZESTORETIC) 20-12.5 MG per tablet Take 1 tablet by mouth daily.  . Multiple Vitamins-Minerals (AIRBORNE PO) Take by mouth.  . Multiple Vitamins-Minerals (EMERGEN-C IMMUNE PO) Take by mouth.  . Vitamin D, Ergocalciferol, (DRISDOL) 50000 units CAPS capsule Take 1 capsule (50,000 Units total) by mouth every 7 (seven) days.   No facility-administered encounter medications on file as of 11/12/2016.    Allergies  Allergen Reactions  . Codeine Nausea And Vomiting and Other (See Comments)    Stomach cramping  . Dilaudid [Hydromorphone Hcl] Nausea And Vomiting and Other (See Comments)    Stomach cramping  . Percocet [Oxycodone-Acetaminophen] Nausea And Vomiting and Other (See Comments)    Stomach cramping  . Zofran Nausea And Vomiting and Other (See Comments)    Stomach cramping  . Latex Itching and  Rash  . Strawberry Extract Hives   Patient Active Problem List   Diagnosis Date Noted  . Hematochezia 11/04/2016  . Grade I hemorrhoids 11/04/2016  . Constipation 11/04/2016  . Breast mass, left 08/01/2016  . 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 History of cervical dysplasia 12/08/2012  . S/P conization of cervix 12/08/2012  . Menopause 12/08/2012   . HTN (hypertension) 09/26/2011   Social History   Social History  . Marital status: Married    Spouse name: N/A  . Number of children: N/A  . Years of education: N/A   Occupational History  . self employeed    Social History Main Topics  . Smoking status: Former Smoker    Quit date: 09/25/1997  . Smokeless tobacco: Never Used  . Alcohol use No     Comment: rare  . Drug use: No  . Sexual activity: Yes    Partners: Male    Birth control/ protection: Surgical   Other Topics Concern  . Not on file   Social History Narrative  . No narrative on file    [name]'s family history includes Breast cancer in her sister and sister; Diabetes in her maternal grandmother; Hypertension in her father, maternal grandfather, maternal grandmother, mother, paternal grandfather, and paternal grandmother; Stomach cancer in her maternal grandmother; Uterine cancer in her mother and paternal grandmother.      Objective:    Vitals:   11/12/16 1055  BP: 138/78  Pulse: 76    Physical Exam   well-developed African-American female in no acute distress blood pressure 138/78 pulse 76, height 5 foot 3, weight 249 him a BMI 44.1. HEENT; nontraumatic, cephalic EOMI PERRLA sclera anicteric, Cardiovascular ;regular rate and rhythm with S1-S2 no murmur or gallop, Pulmonary; clear bilaterally, Abdomen ;obese soft nontender nondistended bowel sounds are active there is no palpable mass or hepatosplenomegaly, Rectal ;exam not repeated this was done per Dr. Toney Rakes last week, patient reports no external hemorrhoids and stool was heme positive. Extremities; no clubbing cyanosis or edema skin warm and dry, Neuropsych ;mood and affect appropriate       Assessment & Plan:   #32 56 year old African-American female with 3-4 week history of persistent bright red blood per rectum occurring with bowel movements. Etiology is not clear, rule out secondary to internal hemorrhoids versus occult colon  lesion #2 hypertension #3 morbid obesity with BMI 44 #4 Chronic GERD-managed with when necessary Tums  Plan; Patient will be scheduled for colonoscopy with Dr. Fuller Plan. Procedure discussed in detail with patient including risks and benefits and she is agreeable to proceed. Further plans pending results of colonoscopy. We did briefly discussed in office hemorrhoidal banding as an option if etiology is found to be internal hemorrhoids.  Amy S Esterwood PA-C 11/12/2016   Cc: Nolene Ebbs, MD

## 2016-11-12 NOTE — Patient Instructions (Signed)
You have been scheduled for a colonoscopy. Please follow written instructions given to you at your visit today.  We have given you a Suprep for the colonoscopy. If you use inhalers (even only as needed), please bring them with you on the day of your procedure. Your physician has requested that you go to www.startemmi.com and enter the access code given to you at your visit today. This web site gives a general overview about your procedure. However, you should still follow specific instructions given to you by our office regarding your preparation for the procedure.

## 2016-11-12 NOTE — Progress Notes (Signed)
Reviewed and agree with management plan.  Shirlee Whitmire T. Junious Ragone, MD FACG 

## 2016-11-17 ENCOUNTER — Encounter: Payer: Self-pay | Admitting: Gastroenterology

## 2016-11-17 ENCOUNTER — Ambulatory Visit (AMBULATORY_SURGERY_CENTER): Payer: BC Managed Care – PPO | Admitting: Gastroenterology

## 2016-11-17 VITALS — BP 145/67 | HR 63 | Temp 97.8°F | Resp 12 | Ht 63.0 in | Wt 249.0 lb

## 2016-11-17 DIAGNOSIS — D123 Benign neoplasm of transverse colon: Secondary | ICD-10-CM | POA: Diagnosis not present

## 2016-11-17 DIAGNOSIS — K921 Melena: Secondary | ICD-10-CM | POA: Diagnosis present

## 2016-11-17 DIAGNOSIS — D120 Benign neoplasm of cecum: Secondary | ICD-10-CM | POA: Diagnosis not present

## 2016-11-17 MED ORDER — SODIUM CHLORIDE 0.9 % IV SOLN
500.0000 mL | INTRAVENOUS | Status: DC
Start: 2016-11-17 — End: 2017-02-06

## 2016-11-17 NOTE — Patient Instructions (Addendum)
YOU HAD AN ENDOSCOPIC PROCEDURE TODAY AT [facility]:   Refer to the procedure report that was given to you for any specific questions about what was found during the examination.  If the procedure report does not answer your questions, please call your gastroenterologist to clarify.  If you requested that your care partner not be given the details of your procedure findings, then the procedure report has been included in a sealed envelope for you to review at your convenience later.  YOU SHOULD EXPECT: Some feelings of bloating in the abdomen. Passage of more gas than usual.  Walking can help get rid of the air that was put into your GI tract during the procedure and reduce the bloating. If you had a lower endoscopy (such as a colonoscopy or flexible sigmoidoscopy) you may notice spotting of blood in your stool or on the toilet paper. If you underwent a bowel prep for your procedure, you may not have a normal bowel movement for a few days.  Please Note:  You might notice some irritation and congestion in your nose or some drainage.  This is from the oxygen used during your procedure.  There is no need for concern and it should clear up in a day or so.  SYMPTOMS TO REPORT IMMEDIATELY:   Following lower endoscopy (colonoscopy or flexible sigmoidoscopy):  Excessive amounts of blood in the stool  Significant tenderness or worsening of abdominal pains  Swelling of the abdomen that is new, acute  Fever of 100F or higher  For urgent or emergent issues, a gastroenterologist can be reached at any hour by calling [phone].  MEDICATIONS:  Continue current medications. Make follow-up appointment at your earliest convenience to discuss medications.  DIET:  We do recommend a small meal at first, but then you may proceed to your regular diet.  Drink plenty of fluids but you should avoid alcoholic beverages for 24 hours. We recommend following a High Fiber Diet. Please see hand-out given  to you at discharge.  ACTIVITY:  You should plan to take it easy for the rest of today and you should NOT DRIVE or use heavy machinery until tomorrow (because of the sedation medicines used during the test).    FOLLOW UP: Our staff will call the number listed on your records the next business day following your procedure to check on you and address any questions or concerns that you may have regarding the information given to you following your procedure. If we do not reach you, we will leave a message.  However, if you are feeling well and you are not experiencing any problems, there is no need to return our call.  We will assume that you have returned to your regular daily activities without incident.  If any biopsies were taken you will be contacted by phone or by letter within the next 1-3 weeks.  Please call us at [phone] if you have not heard about the biopsies in 3 weeks.    SIGNATURES/CONFIDENTIALITY: You and/or your care partner have signed paperwork which will be entered into your electronic medical record.  These signatures attest to the fact that that the information above on your After Visit Summary has been reviewed and is understood.  Full responsibility of the confidentiality of this discharge information lies with you and/or your care-partner.

## 2016-11-17 NOTE — Progress Notes (Signed)
Called to room to assist during endoscopic procedure.  Patient ID and intended procedure confirmed with present staff. Received instructions for my participation in the procedure from the performing physician.  

## 2016-11-17 NOTE — Progress Notes (Signed)
Report to PACU, RN, vss, BBS= Clear.  

## 2016-11-17 NOTE — Op Note (Signed)
[place] Patient Name: [name] Procedure Date: 11/17/2016 1:43 PM MRN: [id number] Endoscopist: Ladene Artist , MD Age: 56 Referring MD:  Date of Birth: October 16, 1960 Gender: Female Account #: [id number] Procedure:                Colonoscopy Indications:              Hematochezia Medicines:                Monitored Anesthesia Care Procedure:                Pre-Anesthesia Assessment:                           - Prior to the procedure, a History and Physical                            was performed, and patient medications and                            allergies were reviewed. The patient's tolerance of                            previous anesthesia was also reviewed. The risks                            and benefits of the procedure and the sedation                            options and risks were discussed with the patient.                            All questions were answered, and informed consent                            was obtained. Prior Anticoagulants: The patient has                            taken no previous anticoagulant or antiplatelet                            agents. ASA Grade Assessment: II - A patient with                            mild systemic disease. After reviewing the risks                            and benefits, the patient was deemed in                            satisfactory condition to undergo the procedure.                           After obtaining informed consent, the colonoscope  was passed under direct vision. Throughout the                            procedure, the patient's blood pressure, pulse, and                            oxygen saturations were monitored continuously. The                            Model PCF-H190L [id number]) scope was introduced                            through the anus and advanced to the the cecum,                            identified by appendiceal orifice and  ileocecal                            valve. The ileocecal valve, appendiceal orifice,                            and rectum were photographed. The quality of the                            bowel preparation was good. The colonoscopy was                            performed without difficulty. The patient tolerated                            the procedure well. Scope In: 1:47:28 PM Scope Out: 2:00:02 PM Scope Withdrawal Time: 0 hours 11 minutes 15 seconds  Total Procedure Duration: 0 hours 12 minutes 34 seconds  Findings:                 The perianal and digital rectal examinations were                            normal.                           Three sessile polyps were found in the transverse                            colon. The polyps were 6 to 7 mm in size. These                            polyps were removed with a cold snare. Resection                            and retrieval were complete.                           Internal hemorrhoids were found during  retroflexion. The hemorrhoids were small and Grade                            I (internal hemorrhoids that do not prolapse).                           Two sessile polyps were found in the transverse                            colon and cecum. The polyps were 4 mm in size.                            These polyps were removed with a cold biopsy                            forceps. Resection and retrieval were complete.                           Multiple medium-mouthed diverticula were found in                            the sigmoid colon. There was narrowing of the colon                            in association with the diverticular opening. There                            was evidence of diverticular spasm. There was no                            evidence of diverticular bleeding.                           The exam was otherwise without abnormality on                            direct and retroflexion  views. Complications:            No immediate complications. Estimated blood loss:                            None. Estimated Blood Loss:     Estimated blood loss: none. Impression:               - Three 6 to 7 mm polyps in the transverse colon,                            removed with a cold snare. Resected and retrieved.                           - Internal hemorrhoids.                           - Two 4 mm polyps in the transverse colon and in  the cecum, removed with a cold biopsy forceps.                            Resected and retrieved.                           - Moderate diverticulosis in the sigmoid colon.                           - The examination was otherwise normal on direct                            and retroflexion views. Recommendation:           - Repeat colonoscopy in 5 years for surveillance if                            polyp(s) are precancerous, otherwise 10 years.                           - Patient has a contact number available for                            emergencies. The signs and symptoms of potential                            delayed complications were discussed with the                            patient. Return to normal activities tomorrow.                            Written discharge instructions were provided to the                            patient.                           - High fiber diet.                           - Continue present medications.                           - Await pathology results. Ladene Artist, MD 11/17/2016 2:06:12 PM This report has been signed electronically.

## 2016-11-17 NOTE — Progress Notes (Signed)
Pt's states no medical or surgical changes since previsit or office visit. 

## 2016-11-18 ENCOUNTER — Other Ambulatory Visit: Payer: Self-pay | Admitting: Gynecology

## 2016-11-18 ENCOUNTER — Telehealth: Payer: Self-pay | Admitting: *Deleted

## 2016-11-18 NOTE — Telephone Encounter (Signed)
  Follow up Call-  Call back number 11/17/2016  Post procedure Call Back phone  # [phone]  Permission to leave phone message Yes  Some recent data might be hidden     Patient questions:  Do you have a fever, pain , or abdominal swelling? No. Pain Score  0 *  Have you tolerated food without any problems? Yes.    Have you been able to return to your normal activities? Yes.    Do you have any questions about your discharge instructions: Diet   No. Medications  No. Follow up visit  No.  Do you have questions or concerns about your Care? No.  Actions: * If pain score is 4 or above: No action needed, pain <4.

## 2016-11-24 ENCOUNTER — Other Ambulatory Visit: Payer: Self-pay | Admitting: Gynecology

## 2016-11-25 ENCOUNTER — Encounter: Payer: Self-pay | Admitting: Gastroenterology

## 2016-12-01 ENCOUNTER — Other Ambulatory Visit: Payer: Self-pay | Admitting: Gynecology

## 2017-02-06 ENCOUNTER — Emergency Department (HOSPITAL_COMMUNITY): Payer: BC Managed Care – PPO

## 2017-02-06 ENCOUNTER — Ambulatory Visit (INDEPENDENT_AMBULATORY_CARE_PROVIDER_SITE_OTHER): Payer: BC Managed Care – PPO | Admitting: Gynecology

## 2017-02-06 ENCOUNTER — Encounter (HOSPITAL_COMMUNITY): Payer: Self-pay | Admitting: Emergency Medicine

## 2017-02-06 ENCOUNTER — Encounter: Payer: Self-pay | Admitting: Gynecology

## 2017-02-06 ENCOUNTER — Emergency Department (HOSPITAL_COMMUNITY)
Admission: EM | Admit: 2017-02-06 | Discharge: 2017-02-06 | Disposition: A | Discharge status: Home / Self Care | Payer: BC Managed Care – PPO | Attending: Emergency Medicine | Admitting: Emergency Medicine

## 2017-02-06 VITALS — BP 132/80 | Temp 98.6°F

## 2017-02-06 DIAGNOSIS — R109 Unspecified abdominal pain: Secondary | ICD-10-CM

## 2017-02-06 DIAGNOSIS — Z91040 Latex allergy status: Secondary | ICD-10-CM | POA: Insufficient documentation

## 2017-02-06 DIAGNOSIS — R350 Frequency of micturition: Secondary | ICD-10-CM

## 2017-02-06 DIAGNOSIS — B9689 Other specified bacterial agents as the cause of diseases classified elsewhere: Secondary | ICD-10-CM

## 2017-02-06 DIAGNOSIS — Z79899 Other long term (current) drug therapy: Secondary | ICD-10-CM | POA: Diagnosis not present

## 2017-02-06 DIAGNOSIS — Z955 Presence of coronary angioplasty implant and graft: Secondary | ICD-10-CM | POA: Diagnosis not present

## 2017-02-06 DIAGNOSIS — N760 Acute vaginitis: Secondary | ICD-10-CM

## 2017-02-06 DIAGNOSIS — B373 Candidiasis of vulva and vagina: Secondary | ICD-10-CM

## 2017-02-06 DIAGNOSIS — Z8673 Personal history of transient ischemic attack (TIA), and cerebral infarction without residual deficits: Secondary | ICD-10-CM | POA: Insufficient documentation

## 2017-02-06 DIAGNOSIS — B3731 Acute candidiasis of vulva and vagina: Secondary | ICD-10-CM

## 2017-02-06 DIAGNOSIS — N281 Cyst of kidney, acquired: Secondary | ICD-10-CM | POA: Diagnosis not present

## 2017-02-06 DIAGNOSIS — N179 Acute kidney failure, unspecified: Secondary | ICD-10-CM

## 2017-02-06 DIAGNOSIS — Z87891 Personal history of nicotine dependence: Secondary | ICD-10-CM | POA: Diagnosis not present

## 2017-02-06 DIAGNOSIS — N898 Other specified noninflammatory disorders of vagina: Secondary | ICD-10-CM | POA: Diagnosis not present

## 2017-02-06 DIAGNOSIS — Z7982 Long term (current) use of aspirin: Secondary | ICD-10-CM | POA: Insufficient documentation

## 2017-02-06 DIAGNOSIS — I10 Essential (primary) hypertension: Secondary | ICD-10-CM | POA: Diagnosis not present

## 2017-02-06 LAB — COMPREHENSIVE METABOLIC PANEL
ALBUMIN: 3.7 g/dL (ref 3.5–5.0)
ALT: 9 U/L — ABNORMAL LOW (ref 14–54)
AST: 24 U/L (ref 15–41)
Alkaline Phosphatase: 82 U/L (ref 38–126)
Anion gap: 9 (ref 5–15)
BILIRUBIN TOTAL: 1 mg/dL (ref 0.3–1.2)
BUN: 11 mg/dL (ref 6–20)
CALCIUM: 8.8 mg/dL — AB (ref 8.9–10.3)
CO2: 25 mmol/L (ref 22–32)
CREATININE: 1.74 mg/dL — AB (ref 0.44–1.00)
Chloride: 107 mmol/L (ref 101–111)
GFR calc Af Amer: 37 mL/min — ABNORMAL LOW (ref >60)
GFR calc non Af Amer: 32 mL/min — ABNORMAL LOW (ref >60)
GLUCOSE: 95 mg/dL (ref 65–99)
Potassium: 3.9 mmol/L (ref 3.5–5.1)
Sodium: 141 mmol/L (ref 135–145)
TOTAL PROTEIN: 8.1 g/dL (ref 6.5–8.1)

## 2017-02-06 LAB — URINALYSIS W MICROSCOPIC + REFLEX CULTURE
Bilirubin Urine: NEGATIVE
CRYSTALS: NONE SEEN [HPF]
Casts: NONE SEEN [LPF]
Glucose, UA: NEGATIVE
Ketones, ur: NEGATIVE
Nitrite: NEGATIVE
Specific Gravity, Urine: <1.005 (ref 1.001–1.035)
pH: 6.5 (ref 5.0–8.0)

## 2017-02-06 LAB — URINALYSIS, ROUTINE W REFLEX MICROSCOPIC
Bacteria, UA: NONE SEEN
Bilirubin Urine: NEGATIVE
GLUCOSE, UA: NEGATIVE mg/dL
KETONES UR: NEGATIVE mg/dL
LEUKOCYTES UA: NEGATIVE
Nitrite: NEGATIVE
PROTEIN: NEGATIVE mg/dL
Specific Gravity, Urine: 1.001 — ABNORMAL LOW (ref 1.005–1.030)
pH: 7 (ref 5.0–8.0)

## 2017-02-06 LAB — WET PREP FOR TRICH, YEAST, CLUE: Trich, Wet Prep: NONE SEEN

## 2017-02-06 LAB — CBC
HEMATOCRIT: 36.1 % (ref 36.0–46.0)
HEMOGLOBIN: 11.6 g/dL — AB (ref 12.0–15.0)
MCH: 25.7 pg — ABNORMAL LOW (ref 26.0–34.0)
MCHC: 32.1 g/dL (ref 30.0–36.0)
MCV: 79.9 fL (ref 78.0–100.0)
Platelets: 229 10*3/uL (ref 150–400)
RBC: 4.52 MIL/uL (ref 3.87–5.11)
RDW: 15.3 % (ref 11.5–15.5)
WBC: 13.2 10*3/uL — ABNORMAL HIGH (ref 4.0–10.5)

## 2017-02-06 LAB — LIPASE, BLOOD: Lipase: 32 U/L (ref 11–51)

## 2017-02-06 MED ORDER — SODIUM CHLORIDE 0.9 % IV BOLUS (SEPSIS): 1000.0000 mL | Freq: Once | INTRAVENOUS | Status: DC

## 2017-02-06 MED ORDER — TRAMADOL HCL 50 MG PO TABS
50.0000 mg | ORAL_TABLET | Freq: Once | ORAL | Status: AC
  Administered 2017-02-06: 50 mg via ORAL
  Filled 2017-02-06: qty 1

## 2017-02-06 MED ORDER — TRAMADOL HCL 50 MG PO TABS: 50.0000 mg | ORAL_TABLET | Freq: Three times a day (TID) | ORAL | 0 refills | Status: DC | PRN

## 2017-02-06 MED ORDER — FENTANYL CITRATE (PF) 100 MCG/2ML IJ SOLN
50.0000 ug | Freq: Once | INTRAMUSCULAR | Status: DC
  Filled 2017-02-06: qty 2

## 2017-02-06 NOTE — ED Triage Notes (Signed)
Pt reports R flank pain for the past few days that has gradually gotten worse. Pt went to PCP who saw blood in urine. No prior hx of kidney stones.

## 2017-02-06 NOTE — ED Notes (Signed)
Coming from PCP's office-states right flank pain for 1 week-states possible hydronephrosis

## 2017-02-06 NOTE — ED Notes (Signed)
Per pt, states she would like an IV now-states she I having pain

## 2017-02-06 NOTE — ED Notes (Signed)
Pt refusing IV at this time. She states that she would rather orally rehydrate and control her pain. Pt states that she is willing to wait on the CT results to determine if an oral route is possible. PA made aware.

## 2017-02-06 NOTE — ED Provider Notes (Signed)
[facility] DEPT Provider Note   CSN: [id number] Arrival date & time: 02/06/17  1158     History   Chief Complaint Chief Complaint  Patient presents with  . Flank Pain    HPI [name] is a 56 y.o. female with a PMHx of HTN, migraines, TIA, cervical dysplasia, hemorrhoids, and chronic constipation, with a PSHx of cholecystectomy, R ovary removal (torsion), and abd hysterectomy, who presents to the ED with complaints of right flank pain 1 week that worsened yesterday. Chart review reveals she was seen at her OBGYN doctor's office just PTA, complaining of R flank pain and some vaginal discharge. Pelvic exam was performed, wet prep showed +yeast and +clue cells, she is being treated by them for BV and yeast vaginosis, but was sent here for evaluation of her flank pain and r/o nephrolithiasis. Her U/A there showed 0-2 RBCs. Patient describes her pain as 10/10 constant throbbing right flank pain that radiates somewhat into the right lateral upper abdomen, worse with movement, and unrelieved with Advil and reiki healing touch by her husband. She reports increased urinary frequency. No history of stones, and no known family history of kidney stones. She reports chronic NSAID use, recently daily this week, but usually more like 2 times per week. PCP Dr. Jeanie Cooks at [facility].   She denies fevers, chills, CP, SOB, nausea/vomiting, diarrhea/constipation, obstipation, melena, hematochezia, hematuria, dysuria, malodorous urine, vaginal bleeding, myalgias, arthralgias, numbness, tingling, focal weakness, or any other complaints at this time. Denies recent travel, sick contacts, suspicious food intake, or EtOH use.    The history is provided by the patient and medical records. No language interpreter was used.  Flank Pain  This is a new problem. The current episode started more than 1 week ago. The problem occurs constantly. The problem has been gradually worsening. Associated symptoms  include abdominal pain. Pertinent negatives include no chest pain and no shortness of breath. Exacerbated by: movement. Nothing relieves the symptoms. Treatments tried: NSAIDs. The treatment provided no relief.    Past Medical History:  Diagnosis Date  . Hypertension   . Migraine   . Panic attacks   . TIA (transient ischemic attack) 2000  . Vitamin D deficiency     Patient Active Problem List   Diagnosis Date Noted  . Hematochezia 11/04/2016  . Grade I hemorrhoids 11/04/2016  . Constipation 11/04/2016  . Breast mass, left 08/01/2016  . 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 History of cervical dysplasia 12/08/2012  . S/P conization of cervix 12/08/2012  . Menopause 12/08/2012  . HTN (hypertension) 09/26/2011    Past Surgical History:  Procedure Laterality Date  . ABDOMINAL HYSTERECTOMY  11/20/2008   TAH/  . CARDIAC CATHETERIZATION  07/2007  . CERVICAL BIOPSY  W/ LOOP ELECTRODE EXCISION    . CERVIX LESION DESTRUCTION    . [place]  . CHOLECYSTECTOMY  1990  . OVARIAN CYST REMOVAL  2007   RIGHT CYST.. BY LAPAROSCOPY  . PELVIC LAPAROSCOPY  12/17/2006   RIGHT OVARIAN TORSION    OB History    Gravida Para Term Preterm AB Living   2 2 2     2    SAB TAB Ectopic Multiple Live Births           2       Home Medications    Prior to Admission medications   Medication Sig Start Date End Date Taking? Authorizing Provider  acetaminophen (TYLENOL) 500 MG tablet Take 1,000 mg by mouth every 6 (  six) hours as needed for moderate pain.    Historical Provider, MD  aspirin-acetaminophen-caffeine (EXCEDRIN MIGRAINE) [phone] MG per tablet Take 2 tablets by mouth every 6 (six) hours as needed for headache or migraine.    Historical Provider, MD  calcium carbonate (TUMS - DOSED IN MG ELEMENTAL CALCIUM) 500 MG chewable tablet Chew 1 tablet by mouth as needed for indigestion or heartburn.    Historical Provider, MD  ibuprofen (ADVIL,MOTRIN) 200 MG tablet Take 400 mg by mouth  every 6 (six) hours as needed for moderate pain.     Historical Provider, MD  linaclotide Rolan Lipa) 145 MCG CAPS capsule Take 1 capsule (145 mcg total) by mouth daily before breakfast. 11/04/16   Terrance Mass, MD  lisinopril-hydrochlorothiazide (PRINZIDE,ZESTORETIC) 20-12.5 MG per tablet Take 1 tablet by mouth daily.    Historical Provider, MD  Multiple Vitamins-Minerals (AIRBORNE PO) Take by mouth.    Historical Provider, MD  Multiple Vitamins-Minerals (EMERGEN-C IMMUNE PO) Take by mouth.    Historical Provider, MD    Family History Family History  Problem Relation Age of Onset  . Hypertension Mother   . Uterine cancer Mother   . Hypertension Father   . Diabetes Maternal Grandmother   . Hypertension Maternal Grandmother   . Stomach cancer Maternal Grandmother   . Hypertension Paternal Grandmother   . Uterine cancer Paternal Grandmother   . Hypertension Maternal Grandfather   . Hypertension Paternal Grandfather   . Breast cancer Sister   . Breast cancer Sister     Social History Social History  Substance Use Topics  . Smoking status: Former Smoker    Quit date: 09/25/1997  . Smokeless tobacco: Never Used  . Alcohol use No     Comment: rare     Allergies   Codeine; Dilaudid [hydromorphone hcl]; Percocet [oxycodone-acetaminophen]; Zofran; Latex; and Strawberry extract   Review of Systems Review of Systems  Constitutional: Negative for chills and fever.  Respiratory: Negative for shortness of breath.   Cardiovascular: Negative for chest pain.  Gastrointestinal: Positive for abdominal pain. Negative for blood in stool, constipation, diarrhea, nausea and vomiting.  Genitourinary: Positive for flank pain and frequency. Negative for dysuria, hematuria and vaginal bleeding.  Musculoskeletal: Negative for arthralgias and myalgias.  Skin: Negative for color change.  Allergic/Immunologic: Negative for immunocompromised state.  Neurological: Negative for weakness and numbness.   Psychiatric/Behavioral: Negative for confusion.   All other systems reviewed and are negative for acute change except as noted in the HPI.    Physical Exam Updated Vital Signs BP (!) 174/90 (BP Location: Right Wrist)   Pulse 60   Temp 98.4 F (36.9 C) (Oral)   Resp 19   Ht 5\' 3"  (1.6 m)   Wt 108.4 kg   LMP 05/08/2009   SpO2 96%   BMI 42.34 kg/m   Physical Exam  Constitutional: She is oriented to person, place, and time. Vital signs are normal. She appears well-developed and well-nourished.  Non-toxic appearance. No distress.  Afebrile, nontoxic, NAD  HENT:  Head: Normocephalic and atraumatic.  Mouth/Throat: Oropharynx is clear and moist and mucous membranes are normal.  Eyes: Conjunctivae and EOM are normal. Right eye exhibits no discharge. Left eye exhibits no discharge.  Neck: Normal range of motion. Neck supple.  Cardiovascular: Normal rate, regular rhythm, normal heart sounds and intact distal pulses.  Exam reveals no gallop and no friction rub.   No murmur heard. Pulmonary/Chest: Effort normal and breath sounds normal. No respiratory distress. She has no decreased  breath sounds. She has no wheezes. She has no rhonchi. She has no rales.  Abdominal: Soft. Normal appearance and bowel sounds are normal. She exhibits no distension. There is tenderness in the right upper quadrant. There is no rigidity, no rebound, no guarding, no CVA tenderness, no tenderness at McBurney's point and negative Murphy's sign.    Soft, obese but nondistended, +BS throughout, with mild R upper lateral abdomen TTP next to the RUQ region, but not really encroaching into the RUQ, extending over to the R flank area, no r/g/r, neg murphy's, neg mcburney's, +R sided CVA TTP   Musculoskeletal: Normal range of motion.  Neurological: She is alert and oriented to person, place, and time. She has normal strength. No sensory deficit.  Skin: Skin is warm, dry and intact. No rash noted.  Psychiatric: She has a  normal mood and affect.  Nursing note and vitals reviewed.    ED Treatments / Results  Labs (all labs ordered are listed, but only abnormal results are displayed) Labs Reviewed  COMPREHENSIVE METABOLIC PANEL - Abnormal; Notable for the following:       Result Value   Creatinine, Ser 1.74 (*)    Calcium 8.8 (*)    ALT 9 (*)    GFR calc non Af Amer 32 (*)    GFR calc Af Amer 37 (*)    All other components within normal limits  CBC - Abnormal; Notable for the following:    WBC 13.2 (*)    Hemoglobin 11.6 (*)    MCH 25.7 (*)    All other components within normal limits  URINALYSIS, ROUTINE W REFLEX MICROSCOPIC - Abnormal; Notable for the following:    Color, Urine COLORLESS (*)    Specific Gravity, Urine 1.001 (*)    Hgb urine dipstick SMALL (*)    Squamous Epithelial / LPF 0-5 (*)    All other components within normal limits  LIPASE, BLOOD    EKG  EKG Interpretation None       Radiology Ct Renal Stone Study  Result Date: 02/06/2017 CLINICAL DATA:  Right flank pain. EXAM: CT ABDOMEN AND PELVIS WITHOUT CONTRAST TECHNIQUE: Multidetector CT imaging of the abdomen and pelvis was performed following the standard protocol without IV contrast. COMPARISON:  CT abdomen pelvis 02/16/2006 FINDINGS: Lower chest: No acute abnormality. Hepatobiliary: No focal liver abnormality is seen. Status post cholecystectomy. No biliary dilatation. Pancreas: Unremarkable. No pancreatic ductal dilatation or surrounding inflammatory changes. Spleen: Normal in size without focal abnormality. Adrenals/Urinary Tract: Adrenal glands are unremarkable. Kidneys are without renal calculi, or hydronephrosis. There is a 12 mm benign-appearing exophytic cyst off of the lateral cortex of the right kidney. Bladder is unremarkable. Stomach/Bowel: Stomach is within normal limits. Appendix appears normal. No evidence of bowel wall thickening, distention, or inflammatory changes. Vascular/Lymphatic: Aortic atherosclerosis.  No enlarged abdominal or pelvic lymph nodes. Nonspecific shotty retroperitoneal and mesenteric lymph nodes. Reproductive: Status post hysterectomy. No adnexal masses. Other: No abdominal wall hernia or abnormality. No abdominopelvic ascites. Musculoskeletal: No acute osseous findings. Posterior facet arthropathy in the lower lumbosacral spine. IMPRESSION: No evidence of obstructive uropathy. Benign-appearing right renal cyst. No evidence of acute abnormalities within the solid abdominal organs, accounting for lack of IV contrast. Facet arthropathy of the lower lumbosacral spine. Electronically Signed   By: Fidela Salisbury M.D.   On: 02/06/2017 15:37    Procedures Procedures (including critical care time)  Medications Ordered in ED Medications  traMADol (ULTRAM) tablet 50 mg (50 mg Oral Given 02/06/17  1743)     Initial Impression / Assessment and Plan / ED Course  I have reviewed the triage vital signs and the nursing notes.  Pertinent labs & imaging results that were available during my care of the patient were reviewed by me and considered in my medical decision making (see chart for details).     56 y.o. female here with 1 wk of R flank pain worse since yesterday, increased urine frequency but no dysuria. Saw OBGYN who is treating her for BV and yeast based on pelvic exam/wet prep at office today, but sent her here due to +hematuria on U/A, to r/o nephrolithiasis. On exam, mild R CVA TTP, mild R upper lateral abdominal TTP, no other abdominal tenderness, afebrile and nontoxic. Labs reveal: lipase WNL, CBC with mildly elevated WBC 13.2. CMP with mildly bumped Cr 1.74 (baseline 1.3-1.4). No U/A result in system, only report on OBGYN note is that there were 0-10 WBCs and 0-2 RBCs. Will repeat this here to get better report. Will get CT renal study to eval for stone/pyelo, although without n/v/fevers it's unlikely for either of these, but will check to ensure this isn't present. Will give pain  meds and fluids. Will reassess shortly.   5:32 PM U/A with 0-5 squamous, 0-5 WBC and RBCs, neg nitrite and leuks, no bacteria seen; doubt UTI given this urine; UCx done at OBGYN's office, so will hold off on sending one on this urine. CT without any acute findings, no kidney stone, benign small R renal cyst; also mild lumbosacral facet arthropathy however that's much lower than her pain is located. Pt refused IV and didn't want IV fentanyl, or intranasal fentanyl; she prefers PO pain meds, but she's allergic to all codeine related medications so our choices are limited; however she reports that she has had tramadol in the past without issue. Will give this here, and to go home with. Advised that her flank pain could be from a recently passed stone, vs musculoskeletal etiology. Advised that she should stay well hydrated with plenty of water, and use scheduled NSAIDs for a few days as well as heat to the area to help with symptoms. Use tylenol and/or tramadol as needed as well. Of note, HTN noted, similar to prior outpatient visits; asymptomatic, doubt need for emergent work up now; advised DASH diet and compliance with BP meds. F/up with PCP in 1wk for recheck. [place] reviewed prior to dispensing controlled substance medications, and 1 year search was notable for: no controlled substances found. Risks/benefits/alternatives and expectations discussed regarding controlled substances. Side effects of medications discussed. Informed consent obtained. I explained the diagnosis and have given explicit precautions to return to the ER including for any other new or worsening symptoms. The patient understands and accepts the medical plan as it's been dictated and I have answered their questions. Discharge instructions concerning home care and prescriptions have been given. The patient is STABLE and is discharged to home in good condition.    Final Clinical Impressions(s) / ED Diagnoses   Final diagnoses:    Right flank pain  Urinary frequency  AKI (acute kidney injury) ([place])  Renal cyst, right  Essential hypertension    New Prescriptions New Prescriptions   TRAMADOL (ULTRAM) 50 MG TABLET    Take 1 tablet (50 mg total) by mouth every 8 (eight) hours as needed for severe pain.     [address] PA-C 02/06/17 [place], [place], MD 02/08/17 [phone]

## 2017-02-06 NOTE — Progress Notes (Signed)
   Patient is a 56 year old with prior hysterectomy presented to the office today complaining of severe right flank pain which is worsening over the past 48 hours. Been present not to the pain level that it is today for over a week. She has had frequent urination but no dysuria. She denies any fever, chills, nausea, vomiting. No change in appetite. She was having a slight vaginal discharge. Patient with no GI complaints and has been passing gas and has been having normal bowel movements.  Exam: Gen. appearance: Patient acute distress been doing over can only her right back side because of the severity of her pain Abdomen: Soft nontender no rebound or guarding positive bowel sounds Back: Tender right flank on palpation patient was went off the table Pelvic exam: Vagina: White discharge was noted, vaginal cuff intact Bimanual exam: No palpable masses or tenderness Rectal exam not done  Urinalysis few yeast, 6-10 WBC, 0-2 RBC culture pending  Wet prep moderate yeast, many clue cells and many white blood cells and too numerous to count bacteria  Assessment/plan: Problem #1 acute right flank pain will be referred to the emergency room at was a normal hospital for further evaluation for possible nephrolithiasis Problem #2 yeast vaginitis a prescription will be called in for Diflucan 150 mg Problem #3 bacterial vaginosis she will be called in a prescription for Tindamax 500 mg tablets she's to take 4 tablets today and repeat in 24 hours  If patient is admitted to the hospital these medications can be prescribed to her as an inpatient. I have notified the emergency room that patient is on her way.

## 2017-02-06 NOTE — Discharge Instructions (Signed)
Take an NSAID like ibuprofen/aleve/etc on a schedule for the next few days (as directed on the over the counter bottle), and then as needed for pain, using tylenol and/or tramadol for breakthrough pain. Do not drive or operate machinery with tramadol use. May need over-the-counter stool softener with this pain medication use. You may also want to try using heat to the area of pain, no more than 20 minutes per hour. Stay well hydrated. Your blood pressure was elevated today; Eat a low-salt diet and take all blood pressure medications as directed by your doctor. Follow up with your regular doctor in 1 week for recheck of symptoms. Return to the ER for emergent changes or worsening symptoms.

## 2017-02-06 NOTE — ED Notes (Signed)
Patients urine was discarded in bathroom before RN was able to go collect it. She will try again in a few

## 2017-02-08 LAB — URINE CULTURE

## 2017-02-09 ENCOUNTER — Other Ambulatory Visit: Payer: Self-pay | Admitting: Gynecology

## 2017-02-09 ENCOUNTER — Telehealth: Payer: Self-pay | Admitting: *Deleted

## 2017-02-09 MED ORDER — NITROFURANTOIN MONOHYD MACRO 100 MG PO CAPS: 100.0000 mg | ORAL_CAPSULE | Freq: Two times a day (BID) | ORAL | 0 refills | Status: DC

## 2017-02-09 MED ORDER — FLUCONAZOLE 150 MG PO TABS: 150.0000 mg | ORAL_TABLET | Freq: Once | ORAL | 0 refills | Status: AC

## 2017-02-09 MED ORDER — TINIDAZOLE 500 MG PO TABS: ORAL_TABLET | ORAL | 0 refills | Status: DC

## 2017-02-09 NOTE — Telephone Encounter (Signed)
Pt was seen on on 02/06/17 prescribed diflucan 150 and tindamax 500 mg #8 take 4 tablets now and repeat in 24 hours was never sent to pharmacy. Rx will be sent today

## 2017-02-18 ENCOUNTER — Encounter: Payer: Self-pay | Admitting: Gynecology

## 2017-07-30 ENCOUNTER — Encounter: Payer: Self-pay | Admitting: Gynecology

## 2017-07-30 ENCOUNTER — Ambulatory Visit (INDEPENDENT_AMBULATORY_CARE_PROVIDER_SITE_OTHER): Payer: BC Managed Care – PPO | Admitting: Gynecology

## 2017-07-30 VITALS — BP 140/86

## 2017-07-30 DIAGNOSIS — B373 Candidiasis of vulva and vagina: Secondary | ICD-10-CM

## 2017-07-30 DIAGNOSIS — N760 Acute vaginitis: Secondary | ICD-10-CM

## 2017-07-30 DIAGNOSIS — B9689 Other specified bacterial agents as the cause of diseases classified elsewhere: Secondary | ICD-10-CM

## 2017-07-30 DIAGNOSIS — B3731 Acute candidiasis of vulva and vagina: Secondary | ICD-10-CM

## 2017-07-30 LAB — WET PREP FOR TRICH, YEAST, CLUE

## 2017-07-30 MED ORDER — FLUCONAZOLE 150 MG PO TABS: 150.0000 mg | ORAL_TABLET | ORAL | 0 refills | Status: DC

## 2017-07-30 MED ORDER — TERCONAZOLE 0.4 % VA CREA
1.0000 | TOPICAL_CREAM | Freq: Every day | VAGINAL | 0 refills | Status: DC
Start: 2017-07-30 — End: 2017-08-24

## 2017-07-30 MED ORDER — METRONIDAZOLE 500 MG PO TABS: 500.0000 mg | ORAL_TABLET | Freq: Two times a day (BID) | ORAL | 0 refills | Status: DC

## 2017-07-30 NOTE — Progress Notes (Signed)
    Toini J. Cutler-Fosque [birth year] [id number]        56 y.o.  [id number] presents with a one-month history of worsening vaginal irritation and discharge.  Also some slight odor.  Notes intense itching.  Has had some recurrent vaginitis over the past year treated with OTC antifungals.  No urinary symptoms such as frequency dysuria urgency low back pain fever or chills.  No nausea vomiting diarrhea constipation.  Has had glucose checked within the past year which was normal  Past medical history,surgical history, problem list, medications, allergies, family history and social history were all reviewed and documented in the EPIC chart.  Directed ROS with pertinent positives and negatives documented in the history of present illness/assessment and plan.  Exam: Caryn Bee assistant Vitals:   07/30/17 0929  BP: 140/86   General appearance:  Normal Abdomen obese without masses guarding rebound Pelvic external BUS vagina with thick cottage cheese discharge.  Bimanual without masses or tenderness  Assessment/Plan:  56 y.o. [id number] with the above history and exam.  Wet prep is positive for both yeast and bacterial vaginosis.  Various treatment regimens reviewed and ultimately we decided on Terazol 7-day cream nightly x7 nights.  Flagyl 500 mg twice daily times 7 days with alcohol avoidance.  As a suppressive regiment will start Diflucan 150 mg weekly times 4 weeks after finishing the other medications.  She will follow-up if her symptoms persist, worsen or recur.  I reviewed other suppressive strategies with her to include possible boric acid suppositories twice weekly but at this point will wait and see if she has more recurrences.  Greater than 50% of my time was spent in direct face to face counseling and coordination of care with the patient.     Anastasio Auerbach MD, 9:44 AM 07/30/2017

## 2017-07-30 NOTE — Patient Instructions (Signed)
Take the Flagyl medication twice daily for 7 days.  Avoid alcohol while taking. Use the Terazol vaginal cream nightly for 7 nights. Take the Diflucan pill once weekly after finishing both of the above medications. Follow-up if your symptoms persist, worsen or recur.

## 2017-08-24 ENCOUNTER — Encounter: Payer: Self-pay | Admitting: Women's Health

## 2017-08-24 ENCOUNTER — Ambulatory Visit: Payer: BC Managed Care – PPO | Admitting: Women's Health

## 2017-08-24 VITALS — BP 130/84

## 2017-08-24 DIAGNOSIS — K640 First degree hemorrhoids: Secondary | ICD-10-CM

## 2017-08-24 MED ORDER — METRONIDAZOLE 500 MG PO TABS: 500.0000 mg | ORAL_TABLET | Freq: Two times a day (BID) | ORAL | 0 refills | Status: DC

## 2017-08-24 MED ORDER — HYDROCORTISONE ACE-PRAMOXINE 2.5-1 % RE CREA: 1.0000 "application " | TOPICAL_CREAM | Freq: Three times a day (TID) | RECTAL | 1 refills | Status: DC

## 2017-08-24 MED ORDER — FLUCONAZOLE 150 MG PO TABS: 150.0000 mg | ORAL_TABLET | ORAL | 0 refills | Status: DC

## 2017-08-24 NOTE — Patient Instructions (Signed)
Hemorrhoids Hemorrhoids are swollen veins in and around the rectum or anus. There are two types of hemorrhoids:  Internal hemorrhoids. These occur in the veins that are just inside the rectum. They may poke through to the outside and become irritated and painful.  External hemorrhoids. These occur in the veins that are outside of the anus and can be felt as a painful swelling or hard lump near the anus.  Most hemorrhoids do not cause serious problems, and they can be managed with home treatments such as diet and lifestyle changes. If home treatments do not help your symptoms, procedures can be done to shrink or remove the hemorrhoids. What are the causes? This condition is caused by increased pressure in the anal area. This pressure may result from various things, including:  Constipation.  Straining to have a bowel movement.  Diarrhea.  Pregnancy.  Obesity.  Sitting for long periods of time.  Heavy lifting or other activity that causes you to strain.  Anal sex.  What are the signs or symptoms? Symptoms of this condition include:  Pain.  Anal itching or irritation.  Rectal bleeding.  Leakage of stool (feces).  Anal swelling.  One or more lumps around the anus.  How is this diagnosed? This condition can often be diagnosed through a visual exam. Other exams or tests may also be done, such as:  Examination of the rectal area with a gloved hand (digital rectal exam).  Examination of the anal canal using a small tube (anoscope).  A blood test, if you have lost a significant amount of blood.  A test to look inside the colon (sigmoidoscopy or colonoscopy).  How is this treated? This condition can usually be treated at home. However, various procedures may be done if dietary changes, lifestyle changes, and other home treatments do not help your symptoms. These procedures can help make the hemorrhoids smaller or remove them completely. Some of these procedures involve  surgery, and others do not. Common procedures include:  Rubber band ligation. Rubber bands are placed at the base of the hemorrhoids to cut off the blood supply to them.  Sclerotherapy. Medicine is injected into the hemorrhoids to shrink them.  Infrared coagulation. A type of light energy is used to get rid of the hemorrhoids.  Hemorrhoidectomy surgery. The hemorrhoids are surgically removed, and the veins that supply them are tied off.  Stapled hemorrhoidopexy surgery. A circular stapling device is used to remove the hemorrhoids and use staples to cut off the blood supply to them.  Follow these instructions at home: Eating and drinking  Eat foods that have a lot of fiber in them, such as whole grains, beans, nuts, fruits, and vegetables. Ask your health care provider about taking products that have added fiber (fiber supplements).  Drink enough fluid to keep your urine clear or pale yellow. Managing pain and swelling  Take warm sitz baths for 20 minutes, 3-4 times a day to ease pain and discomfort.  If directed, apply ice to the affected area. Using ice packs between sitz baths may be helpful. ? Put ice in a plastic bag. ? Place a towel between your skin and the bag. ? Leave the ice on for 20 minutes, 2-3 times a day. General instructions  Take over-the-counter and prescription medicines only as told by your health care provider.  Use medicated creams or suppositories as told.  Exercise regularly.  Go to the bathroom when you have the urge to have a bowel movement. Do not wait.    Avoid straining to have bowel movements.  Keep the anal area dry and clean. Use wet toilet paper or moist towelettes after a bowel movement.  Do not sit on the toilet for long periods of time. This increases blood pooling and pain. Contact a health care provider if:  You have increasing pain and swelling that are not controlled by treatment or medicine.  You have uncontrolled bleeding.  You  have difficulty having a bowel movement, or you are unable to have a bowel movement.  You have pain or inflammation outside the area of the hemorrhoids. This information is not intended to replace advice given to you by your health care provider. Make sure you discuss any questions you have with your health care provider. Document Released: 09/19/2000 Document Revised: 02/20/2016 Document Reviewed: 06/06/2015 Elsevier Interactive Patient Education  2017 [employer]  

## 2017-08-24 NOTE — Progress Notes (Signed)
56 year old MBF G2 P2 with complaint of bright red blood noted on toilet tissue after bowel movement 1.  Denies black or tarry stools. Has had  problems with constipation in the past, on linzess. Has a job where  bathroom breaks are limited.  Was treated for bacteria vaginosis and yeast 07/30/2017, did not complete medication. Had a neice who died in  [place] in [place] and medication was left by error when visiting.   Denies urinary symptoms, abdominal/ back pain or fever.  2018 benign colon polyp on colonoscopy. Denies rectal bleeding.  Has had elevated blood pressure in the past, currently on no medication. Hysterectomy on no HRT.    Exam: Appears well.  Obese. 0.5 cm non thrombosed hemorrhoid. Rectal exam several small hemorrhoids, no visible blood.    Small hemorrhoid  Plan: Analpram 2.5-1% cream prescription, proper use given and reviewed. Instructed to call if persistent bleeding or pain .Reassurance given.  Obstipation prevention discussed. Encouraged low salt, low-calorie/carbohydrate diet.  Refills of Flagyl 500 twice daily for 7 days avoid alcohol, and Diflucan 150 weekly for 4 weeks prescriptions given.

## 2018-04-29 ENCOUNTER — Ambulatory Visit: Payer: BC Managed Care – PPO | Admitting: Obstetrics & Gynecology

## 2018-04-29 ENCOUNTER — Encounter: Payer: Self-pay | Admitting: Obstetrics & Gynecology

## 2018-04-29 VITALS — BP 136/88

## 2018-04-29 DIAGNOSIS — N898 Other specified noninflammatory disorders of vagina: Secondary | ICD-10-CM | POA: Diagnosis not present

## 2018-04-29 DIAGNOSIS — K640 First degree hemorrhoids: Secondary | ICD-10-CM | POA: Diagnosis not present

## 2018-04-29 LAB — WET PREP FOR TRICH, YEAST, CLUE

## 2018-04-29 MED ORDER — HYDROCORTISONE ACE-PRAMOXINE 2.5-1 % RE CREA: 1.0000 "application " | TOPICAL_CREAM | Freq: Three times a day (TID) | RECTAL | 2 refills | Status: DC

## 2018-04-29 NOTE — Progress Notes (Signed)
    [name] May 07, 1961 [id number]        57 y.o.  [id number] Married  RP: Vaginal itching and odor  HPI: Treated in October and November for a bacterial vaginosis with Flagyl and yeast infection with nystatin and Diflucan.  The treatment was successful.  Patient had no problem until a few days ago when she noticed a vaginal odor with mild itching.  Also feels that her hemorrhoids are "acting up", feels irritated but no rectal bleeding.  No current pelvic pain.  No urinary tract infection symptoms.   OB History  Gravida Para Term Preterm AB Living  2 2 2     2   SAB TAB Ectopic Multiple Live Births          2    # Outcome Date GA Lbr Len/2nd Weight Sex Delivery Anes PTL Lv  2 Term     F CS-Unspec  N LIV  1 Term     F Vag-Spont  N LIV    Past medical history,surgical history, problem list, medications, allergies, family history and social history were all reviewed and documented in the EPIC chart.   Directed ROS with pertinent positives and negatives documented in the history of present illness/assessment and plan.  Exam:  Vitals:   04/29/18 1440  BP: 136/88   General appearance:  Normal  Abdomen: Normal  Gynecologic exam: Vulva normal.  Speculum: Cervix and vagina normal.  Mild vaginal discharge.  Wet prep done.  Anal area: Small external hemorrhoids.  No bleeding.  Wet prep negative   Assessment/Plan:  57 y.o. G2P2002   1. Vaginal pruritus and odor Patient reassured that her wet prep was negative.  Recommend using probiotic tablets or suppositories vaginally weekly for prevention. - WET PREP FOR TRICH, YEAST, CLUE  2. Grade I hemorrhoids Mildly symptomatic hemorrhoids.  Analpram-HC represcribed.  Other orders - hydrocortisone-pramoxine (ANALPRAM HC) 2.5-1 % rectal cream; Place 1 application rectally 3 (three) times daily.  Patient will schedule annual gynecologic exam.  Counseling on above issues and coordination of care more than 50% for 15  minutes.  Princess Bruins MD, 2:45 PM 04/29/2018

## 2018-04-29 NOTE — Patient Instructions (Addendum)
1. Vaginal pruritus and odor Patient reassured that her wet prep was negative.  Recommend using probiotic tablets or suppositories vaginally weekly for prevention. - WET PREP FOR TRICH, YEAST, CLUE  2. Grade I hemorrhoids Mildly symptomatic hemorrhoids.  Analpram-HC represcribed.  Other orders - hydrocortisone-pramoxine (ANALPRAM HC) 2.5-1 % rectal cream; Place 1 application rectally 3 (three) times daily.  Patient will schedule annual gynecologic exam.  [name], it was a pleasure meeting you today!

## 2018-07-31 ENCOUNTER — Other Ambulatory Visit: Payer: Self-pay | Admitting: Family Medicine

## 2018-07-31 DIAGNOSIS — I129 Hypertensive chronic kidney disease with stage 1 through stage 4 chronic kidney disease, or unspecified chronic kidney disease: Secondary | ICD-10-CM

## 2018-08-04 ENCOUNTER — Ambulatory Visit
Admission: RE | Admit: 2018-08-04 | Discharge: 2018-08-04 | Disposition: A | Discharge status: Home / Self Care | Payer: BC Managed Care – PPO | Source: Ambulatory Visit | Attending: Family Medicine | Admitting: Family Medicine

## 2018-08-04 DIAGNOSIS — I129 Hypertensive chronic kidney disease with stage 1 through stage 4 chronic kidney disease, or unspecified chronic kidney disease: Secondary | ICD-10-CM

## 2018-08-17 ENCOUNTER — Encounter: Payer: BC Managed Care – PPO | Admitting: Obstetrics & Gynecology

## 2018-09-07 ENCOUNTER — Other Ambulatory Visit: Payer: Self-pay | Admitting: Family Medicine

## 2018-09-07 DIAGNOSIS — Z1231 Encounter for screening mammogram for malignant neoplasm of breast: Secondary | ICD-10-CM

## 2018-10-08 ENCOUNTER — Encounter: Payer: BC Managed Care – PPO | Admitting: Obstetrics & Gynecology

## 2018-10-29 ENCOUNTER — Ambulatory Visit: Payer: BC Managed Care – PPO

## 2018-11-26 ENCOUNTER — Ambulatory Visit: Payer: BC Managed Care – PPO

## 2018-12-03 ENCOUNTER — Encounter: Payer: BC Managed Care – PPO | Admitting: Obstetrics & Gynecology

## 2018-12-24 ENCOUNTER — Encounter: Payer: BC Managed Care – PPO | Admitting: Obstetrics & Gynecology

## 2019-01-10 ENCOUNTER — Ambulatory Visit: Payer: BC Managed Care – PPO

## 2019-03-04 ENCOUNTER — Ambulatory Visit: Payer: BC Managed Care – PPO

## 2019-03-04 ENCOUNTER — Encounter: Payer: BC Managed Care – PPO | Admitting: Obstetrics & Gynecology

## 2019-03-28 ENCOUNTER — Encounter: Payer: BC Managed Care – PPO | Admitting: Obstetrics & Gynecology

## 2019-03-28 ENCOUNTER — Other Ambulatory Visit: Payer: Self-pay

## 2019-03-29 ENCOUNTER — Ambulatory Visit (INDEPENDENT_AMBULATORY_CARE_PROVIDER_SITE_OTHER): Payer: BC Managed Care – PPO | Admitting: Obstetrics & Gynecology

## 2019-03-29 ENCOUNTER — Encounter: Payer: Self-pay | Admitting: Obstetrics & Gynecology

## 2019-03-29 VITALS — BP 132/78 | Ht 62.0 in | Wt 236.0 lb

## 2019-03-29 DIAGNOSIS — K649 Unspecified hemorrhoids: Secondary | ICD-10-CM | POA: Diagnosis not present

## 2019-03-29 DIAGNOSIS — Z1272 Encounter for screening for malignant neoplasm of vagina: Secondary | ICD-10-CM

## 2019-03-29 DIAGNOSIS — Z01419 Encounter for gynecological examination (general) (routine) without abnormal findings: Secondary | ICD-10-CM

## 2019-03-29 DIAGNOSIS — Z780 Asymptomatic menopausal state: Secondary | ICD-10-CM

## 2019-03-29 DIAGNOSIS — Z6841 Body Mass Index (BMI) 40.0 and over, adult: Secondary | ICD-10-CM | POA: Diagnosis not present

## 2019-03-29 MED ORDER — HYDROCORTISONE ACETATE 25 MG RE SUPP: 25.0000 mg | Freq: Two times a day (BID) | RECTAL | 0 refills | Status: AC

## 2019-03-29 NOTE — Progress Notes (Signed)
[name] [birth year] [id number]   History:    58 y.o. G2P2L2 Married  RP:  Established patient presenting for annual gyn exam   HPI: Menopause, well on no hormone replacement therapy.  No postmenopausal bleeding.  No pelvic pain.  No pain with intercourse.  Urine normal.  Bowel movements normal, except for spotting when passing a bowel movement with known hemorrhoids.  Breast normal.  Body mass index 43.16.  Needs to exercise more.  Health labs with family physician.  Last colonoscopy February 2018.  Past medical history,surgical history, family history and social history were all reviewed and documented in the EPIC chart.  Gynecologic History Patient's last menstrual period was 05/08/2009. Contraception: post menopausal status Last Pap: 08/2016. Results were: Negative Last mammogram: 08/2016. Results were: Negative Bone Density: Never Colonoscopy: 11/2016  Obstetric History OB History  Gravida Para Term Preterm AB Living  2 2 2     2   SAB TAB Ectopic Multiple Live Births          2    # Outcome Date GA Lbr Len/2nd Weight Sex Delivery Anes PTL Lv  2 Term     F CS-Unspec  N LIV  1 Term     F Vag-Spont  N LIV     ROS: A ROS was performed and pertinent positives and negatives are included in the history.  GENERAL: No fevers or chills. HEENT: No change in vision, no earache, sore throat or sinus congestion. NECK: No pain or stiffness. CARDIOVASCULAR: No chest pain or pressure. No palpitations. PULMONARY: No shortness of breath, cough or wheeze. GASTROINTESTINAL: No abdominal pain, nausea, vomiting or diarrhea, melena or bright red blood per rectum. GENITOURINARY: No urinary frequency, urgency, hesitancy or dysuria. MUSCULOSKELETAL: No joint or muscle pain, no back pain, no recent trauma. DERMATOLOGIC: No rash, no itching, no lesions. ENDOCRINE: No polyuria, polydipsia, no heat or cold intolerance. No recent change in weight. HEMATOLOGICAL: No anemia or easy bruising or  bleeding. NEUROLOGIC: No headache, seizures, numbness, tingling or weakness. PSYCHIATRIC: No depression, no loss of interest in normal activity or change in sleep pattern.     Exam:   BP 132/78 (BP Location: Right Arm, Patient Position: Sitting, Cuff Size: Large)   Ht 5\' 2"  (1.575 m)   Wt 236 lb (107 kg)   LMP 05/08/2009   BMI 43.16 kg/m   Body mass index is 43.16 kg/m.  General appearance : Well developed well nourished female. No acute distress HEENT: Eyes: no retinal hemorrhage or exudates,  Neck supple, trachea midline, no carotid bruits, no thyroidmegaly Lungs: Clear to auscultation, no rhonchi or wheezes, or rib retractions  Heart: Regular rate and rhythm, no murmurs or gallops Breast:Examined in sitting and supine position were symmetrical in appearance, no palpable masses or tenderness,  no skin retraction, no nipple inversion, no nipple discharge, no skin discoloration, no axillary or supraclavicular lymphadenopathy Abdomen: no palpable masses or tenderness, no rebound or guarding Extremities: no edema or skin discoloration or tenderness  Pelvic: Vulva: Normal             Vagina: No gross lesions or discharge.  Pap reflex done  Cervix/Uterus absent  Adnexa  Without masses or tenderness  Anus: Normal   Assessment/Plan:  58 y.o. female for annual exam   1. Encounter for Papanicolaou smear of vagina as part of routine gynecological examination Gynecologic exam status post total hysterectomy.  Pap reflex done on the vaginal vault.  Breast exam normal.  Will schedule  a screening mammogram now.  Last bone density February 2018.  Health labs with family physician.  2. Postmenopause Status post total hysterectomy.  Well on no hormone replacement therapy.  Recommend vitamin D supplements, calcium intake of 1200 mg daily and regular weightbearing physical activities.  3. Bleeding hemorrhoids Will attempt treatment with Anusol HC suppositories twice daily for 14 days.  If no  improvement, recommend evaluation with gastroenterology.  4. Class 3 severe obesity due to excess calories with serious comorbidity and body mass index (BMI) of 40.0 to 44.9 in adult [facility]) Recommend a lower calorie/carb diet such as [employer].  Aerobic physical activities 5 times a week and weightlifting every 2 days recommended.  Other orders - amLODipine (NORVASC) 10 MG tablet; Take 1 tablet by mouth daily. - hydrochlorothiazide (HYDRODIURIL) 12.5 MG tablet; Take 12.5 mg by mouth daily. - traMADol (ULTRAM) 50 MG tablet; as needed. - Multiple Vitamins-Minerals (ZINC PO); Take 1 tablet by mouth daily. - hydrocortisone (ANUSOL-HC) 25 MG suppository; Place 1 suppository (25 mg total) rectally 2 (two) times daily for 14 days.  Counseling on above issues and coordination of care more than 50% for 10 minutes.  Princess Bruins MD, 4:18 PM 03/29/2019

## 2019-03-30 LAB — PAP IG W/ RFLX HPV ASCU

## 2019-04-04 NOTE — Patient Instructions (Signed)
1. Encounter for Papanicolaou smear of vagina as part of routine gynecological examination Gynecologic exam status post total hysterectomy.  Pap reflex done on the vaginal vault.  Breast exam normal.  Will schedule a screening mammogram now.  Last bone density February 2018.  Health labs with family physician.  2. Postmenopause Status post total hysterectomy.  Well on no hormone replacement therapy.  Recommend vitamin D supplements, calcium intake of 1200 mg daily and regular weightbearing physical activities.  3. Bleeding hemorrhoids Will attempt treatment with Anusol HC suppositories twice daily for 14 days.  If no improvement, recommend evaluation with gastroenterology.  4. Class 3 severe obesity due to excess calories with serious comorbidity and body mass index (BMI) of 40.0 to 44.9 in adult [facility]) Recommend a lower calorie/carb diet such as [employer].  Aerobic physical activities 5 times a week and weightlifting every 2 days recommended.  Other orders - amLODipine (NORVASC) 10 MG tablet; Take 1 tablet by mouth daily. - hydrochlorothiazide (HYDRODIURIL) 12.5 MG tablet; Take 12.5 mg by mouth daily. - traMADol (ULTRAM) 50 MG tablet; as needed. - Multiple Vitamins-Minerals (ZINC PO); Take 1 tablet by mouth daily. - hydrocortisone (ANUSOL-HC) 25 MG suppository; Place 1 suppository (25 mg total) rectally 2 (two) times daily for 14 days.  Donn, it was a pleasure seeing you today!  I will inform you of your results as soon as they are available.

## 2019-04-15 ENCOUNTER — Ambulatory Visit: Payer: BC Managed Care – PPO

## 2019-05-27 ENCOUNTER — Ambulatory Visit: Payer: BC Managed Care – PPO

## 2019-07-05 ENCOUNTER — Encounter: Payer: Self-pay | Admitting: Gynecology

## 2019-07-08 ENCOUNTER — Ambulatory Visit: Payer: BC Managed Care – PPO

## 2019-08-12 ENCOUNTER — Ambulatory Visit
Admission: RE | Admit: 2019-08-12 | Discharge: 2019-08-12 | Disposition: A | Discharge status: Home / Self Care | Payer: BC Managed Care – PPO | Source: Ambulatory Visit | Attending: Family Medicine | Admitting: Family Medicine

## 2019-08-12 ENCOUNTER — Other Ambulatory Visit: Payer: Self-pay

## 2019-08-12 DIAGNOSIS — Z1231 Encounter for screening mammogram for malignant neoplasm of breast: Secondary | ICD-10-CM

## 2019-10-13 ENCOUNTER — Ambulatory Visit
Admission: RE | Admit: 2019-10-13 | Discharge: 2019-10-13 | Disposition: A | Discharge status: Home / Self Care | Payer: BC Managed Care – PPO | Source: Ambulatory Visit | Attending: Certified Nurse Midwife | Admitting: Certified Nurse Midwife

## 2019-10-13 ENCOUNTER — Other Ambulatory Visit: Payer: Self-pay | Admitting: Certified Nurse Midwife

## 2019-10-13 DIAGNOSIS — J40 Bronchitis, not specified as acute or chronic: Secondary | ICD-10-CM

## 2020-07-06 ENCOUNTER — Other Ambulatory Visit: Payer: Self-pay | Admitting: Family Medicine

## 2020-07-06 DIAGNOSIS — Z1231 Encounter for screening mammogram for malignant neoplasm of breast: Secondary | ICD-10-CM

## 2020-07-16 ENCOUNTER — Other Ambulatory Visit: Payer: Self-pay | Admitting: Family Medicine

## 2020-07-16 DIAGNOSIS — N63 Unspecified lump in unspecified breast: Secondary | ICD-10-CM

## 2020-07-17 ENCOUNTER — Other Ambulatory Visit: Payer: Self-pay | Admitting: Family Medicine

## 2020-07-17 DIAGNOSIS — N6320 Unspecified lump in the left breast, unspecified quadrant: Secondary | ICD-10-CM

## 2020-08-07 ENCOUNTER — Ambulatory Visit
Admission: RE | Admit: 2020-08-07 | Discharge: 2020-08-07 | Disposition: A | Discharge status: Home / Self Care | Payer: BC Managed Care – PPO | Source: Ambulatory Visit | Attending: Family Medicine | Admitting: Family Medicine

## 2020-08-07 ENCOUNTER — Other Ambulatory Visit: Payer: Self-pay

## 2020-08-07 DIAGNOSIS — N6320 Unspecified lump in the left breast, unspecified quadrant: Secondary | ICD-10-CM

## 2021-12-11 ENCOUNTER — Encounter: Payer: Self-pay | Admitting: Gastroenterology

## 2022-01-20 ENCOUNTER — Other Ambulatory Visit: Payer: Self-pay | Admitting: Family Medicine

## 2022-01-20 DIAGNOSIS — Z1231 Encounter for screening mammogram for malignant neoplasm of breast: Secondary | ICD-10-CM

## 2022-01-23 ENCOUNTER — Ambulatory Visit: Payer: BC Managed Care – PPO

## 2022-02-07 ENCOUNTER — Ambulatory Visit
Admission: RE | Admit: 2022-02-07 | Discharge: 2022-02-07 | Disposition: A | Discharge status: Home / Self Care | Payer: BC Managed Care – PPO | Source: Ambulatory Visit | Attending: Family Medicine | Admitting: Family Medicine

## 2022-02-07 DIAGNOSIS — Z1231 Encounter for screening mammogram for malignant neoplasm of breast: Secondary | ICD-10-CM

## 2022-04-11 ENCOUNTER — Encounter: Payer: Self-pay | Admitting: Physician Assistant

## 2022-05-06 ENCOUNTER — Ambulatory Visit: Payer: BC Managed Care – PPO | Admitting: Physician Assistant

## 2022-09-08 ENCOUNTER — Emergency Department
Admission: EM | Admit: 2022-09-08 | Discharge: 2022-09-08 | Discharge status: Against Medical Advice | Payer: BC Managed Care – PPO | Attending: Emergency Medicine | Admitting: Emergency Medicine

## 2022-09-08 ENCOUNTER — Encounter: Payer: Self-pay | Admitting: Emergency Medicine

## 2022-09-08 ENCOUNTER — Emergency Department: Payer: BC Managed Care – PPO

## 2022-09-08 ENCOUNTER — Other Ambulatory Visit: Payer: Self-pay

## 2022-09-08 DIAGNOSIS — N644 Mastodynia: Secondary | ICD-10-CM | POA: Diagnosis present

## 2022-09-08 DIAGNOSIS — Z5321 Procedure and treatment not carried out due to patient leaving prior to being seen by health care provider: Secondary | ICD-10-CM | POA: Diagnosis not present

## 2022-09-08 LAB — COMPREHENSIVE METABOLIC PANEL
ALT: 11 U/L (ref 0–44)
AST: 15 U/L (ref 15–41)
Albumin: 3.9 g/dL (ref 3.5–5.0)
Alkaline Phosphatase: 92 U/L (ref 38–126)
Anion gap: 6 (ref 5–15)
BUN: 24 mg/dL — ABNORMAL HIGH (ref 8–23)
CO2: 25 mmol/L (ref 22–32)
Calcium: 9.2 mg/dL (ref 8.9–10.3)
Chloride: 109 mmol/L (ref 98–111)
Creatinine, Ser: 2.45 mg/dL — ABNORMAL HIGH (ref 0.44–1.00)
GFR, Estimated: 22 mL/min — ABNORMAL LOW (ref >60)
Glucose, Bld: 126 mg/dL — ABNORMAL HIGH (ref 70–99)
Potassium: 4.3 mmol/L (ref 3.5–5.1)
Sodium: 140 mmol/L (ref 135–145)
Total Bilirubin: 0.5 mg/dL (ref 0.3–1.2)
Total Protein: 8 g/dL (ref 6.5–8.1)

## 2022-09-08 LAB — URINALYSIS, ROUTINE W REFLEX MICROSCOPIC
Bilirubin Urine: NEGATIVE
Glucose, UA: NEGATIVE mg/dL
Hgb urine dipstick: NEGATIVE
Ketones, ur: NEGATIVE mg/dL
Leukocytes,Ua: NEGATIVE
Nitrite: NEGATIVE
Protein, ur: 30 mg/dL — AB
Specific Gravity, Urine: 1.004 — ABNORMAL LOW (ref 1.005–1.030)
pH: 5 (ref 5.0–8.0)

## 2022-09-08 LAB — CBC
HCT: 38.1 % (ref 36.0–46.0)
Hemoglobin: 11.7 g/dL — ABNORMAL LOW (ref 12.0–15.0)
MCH: 25.1 pg — ABNORMAL LOW (ref 26.0–34.0)
MCHC: 30.7 g/dL (ref 30.0–36.0)
MCV: 81.6 fL (ref 80.0–100.0)
Platelets: 226 10*3/uL (ref 150–400)
RBC: 4.67 MIL/uL (ref 3.87–5.11)
RDW: 15.2 % (ref 11.5–15.5)
WBC: 10.9 10*3/uL — ABNORMAL HIGH (ref 4.0–10.5)
nRBC: 0 % (ref 0.0–0.2)

## 2022-09-08 LAB — LIPASE, BLOOD: Lipase: 53 U/L — ABNORMAL HIGH (ref 11–51)

## 2022-09-08 LAB — TROPONIN I (HIGH SENSITIVITY): Troponin I (High Sensitivity): 4 ng/L (ref <18)

## 2022-09-08 NOTE — ED Triage Notes (Signed)
Pt to ED via POV. Pt c/o pain that starts under R breast and radiates around to under R shoulder blade. Pt states pain started last night, but woke her up this morning at approx 0300.

## 2022-09-09 ENCOUNTER — Other Ambulatory Visit: Payer: Self-pay | Admitting: Family Medicine

## 2022-09-09 ENCOUNTER — Ambulatory Visit
Admission: RE | Admit: 2022-09-09 | Discharge: 2022-09-09 | Disposition: A | Discharge status: Home / Self Care | Payer: BC Managed Care – PPO | Source: Ambulatory Visit | Attending: Family Medicine | Admitting: Family Medicine

## 2022-09-09 DIAGNOSIS — M546 Pain in thoracic spine: Secondary | ICD-10-CM

## 2022-12-30 ENCOUNTER — Other Ambulatory Visit (HOSPITAL_COMMUNITY): Payer: Self-pay

## 2022-12-30 MED ORDER — FAMOTIDINE 40 MG PO TABS
40.0000 mg | ORAL_TABLET | Freq: Every day | ORAL | 0 refills | Status: DC
  Filled 2022-12-30: qty 30, 30d supply, fill #0

## 2022-12-30 MED ORDER — HYDROCORTISONE ACETATE 30 MG RE SUPP
30.0000 mg | Freq: Two times a day (BID) | RECTAL | 0 refills | Status: DC
  Filled 2022-12-30: qty 24, 12d supply, fill #0

## 2022-12-31 ENCOUNTER — Encounter: Payer: Self-pay | Admitting: Nurse Practitioner

## 2022-12-31 ENCOUNTER — Other Ambulatory Visit (HOSPITAL_COMMUNITY): Payer: Self-pay

## 2023-02-13 ENCOUNTER — Ambulatory Visit: Payer: BC Managed Care – PPO | Admitting: Nurse Practitioner

## 2023-03-10 ENCOUNTER — Other Ambulatory Visit: Payer: Self-pay | Admitting: Family Medicine

## 2023-03-10 ENCOUNTER — Ambulatory Visit
Admission: RE | Admit: 2023-03-10 | Discharge: 2023-03-10 | Disposition: A | Discharge status: Home / Self Care | Payer: BC Managed Care – PPO | Source: Ambulatory Visit | Attending: Family Medicine | Admitting: Family Medicine

## 2023-03-10 DIAGNOSIS — R0789 Other chest pain: Secondary | ICD-10-CM

## 2023-06-30 ENCOUNTER — Other Ambulatory Visit: Payer: Self-pay | Admitting: Family Medicine

## 2023-06-30 DIAGNOSIS — Z1231 Encounter for screening mammogram for malignant neoplasm of breast: Secondary | ICD-10-CM

## 2023-07-09 ENCOUNTER — Ambulatory Visit: Payer: BC Managed Care – PPO

## 2023-08-12 ENCOUNTER — Ambulatory Visit: Payer: BC Managed Care – PPO

## 2023-08-31 ENCOUNTER — Ambulatory Visit
Admission: RE | Admit: 2023-08-31 | Discharge: 2023-08-31 | Disposition: A | Discharge status: Home / Self Care | Payer: BC Managed Care – PPO | Source: Ambulatory Visit | Attending: Family Medicine | Admitting: Family Medicine

## 2023-08-31 DIAGNOSIS — Z1231 Encounter for screening mammogram for malignant neoplasm of breast: Secondary | ICD-10-CM

## 2024-08-16 ENCOUNTER — Encounter (HOSPITAL_BASED_OUTPATIENT_CLINIC_OR_DEPARTMENT_OTHER): Payer: Self-pay

## 2024-09-12 ENCOUNTER — Other Ambulatory Visit: Payer: Self-pay | Admitting: Family Medicine

## 2024-09-12 DIAGNOSIS — Z1231 Encounter for screening mammogram for malignant neoplasm of breast: Secondary | ICD-10-CM

## 2024-10-10 ENCOUNTER — Ambulatory Visit

## 2024-11-07 ENCOUNTER — Ambulatory Visit

## 2024-11-28 ENCOUNTER — Ambulatory Visit
